# Patient Record
Sex: Male | Born: 1960 | Race: White | Hispanic: No | Marital: Married | Smoking: Former smoker
Health system: Southern US, Community
[De-identification: ages and names within clinical notes are randomized; demographics above are authoritative.]

## PROBLEM LIST (undated history)

## (undated) DIAGNOSIS — I10 Essential (primary) hypertension: Secondary | ICD-10-CM

## (undated) DIAGNOSIS — G4730 Sleep apnea, unspecified: Secondary | ICD-10-CM

## (undated) DIAGNOSIS — E785 Hyperlipidemia, unspecified: Secondary | ICD-10-CM

## (undated) DIAGNOSIS — N200 Calculus of kidney: Secondary | ICD-10-CM

## (undated) DIAGNOSIS — K5090 Crohn's disease, unspecified, without complications: Secondary | ICD-10-CM

## (undated) DIAGNOSIS — K635 Polyp of colon: Secondary | ICD-10-CM

## (undated) DIAGNOSIS — E119 Type 2 diabetes mellitus without complications: Secondary | ICD-10-CM

## (undated) HISTORY — PX: OTHER SURGICAL HISTORY: SHX169

## (undated) HISTORY — DX: Hyperlipidemia, unspecified: E78.5

## (undated) HISTORY — DX: Type 2 diabetes mellitus without complications: E11.9

## (undated) HISTORY — DX: Crohn's disease, unspecified, without complications: K50.90

## (undated) HISTORY — DX: Calculus of kidney: N20.0

## (undated) HISTORY — PX: ABDOMINAL SURGERY: SHX537

## (undated) HISTORY — DX: Sleep apnea, unspecified: G47.30

## (undated) HISTORY — DX: Essential (primary) hypertension: I10

## (undated) HISTORY — DX: Polyp of colon: K63.5

---

## 2021-06-25 ENCOUNTER — Ambulatory Visit: Payer: BC Managed Care – PPO | Admitting: Family Medicine

## 2021-06-25 ENCOUNTER — Encounter: Payer: Self-pay | Admitting: Family Medicine

## 2021-06-25 ENCOUNTER — Other Ambulatory Visit: Payer: Self-pay

## 2021-06-25 ENCOUNTER — Telehealth: Payer: Self-pay | Admitting: *Deleted

## 2021-06-25 VITALS — BP 121/74 | HR 110 | Temp 98.5°F | Ht 71.0 in | Wt 242.8 lb

## 2021-06-25 DIAGNOSIS — K5010 Crohn's disease of large intestine without complications: Secondary | ICD-10-CM

## 2021-06-25 DIAGNOSIS — Z122 Encounter for screening for malignant neoplasm of respiratory organs: Secondary | ICD-10-CM

## 2021-06-25 DIAGNOSIS — E1169 Type 2 diabetes mellitus with other specified complication: Secondary | ICD-10-CM | POA: Diagnosis not present

## 2021-06-25 DIAGNOSIS — E756 Lipid storage disorder, unspecified: Secondary | ICD-10-CM

## 2021-06-25 DIAGNOSIS — F17200 Nicotine dependence, unspecified, uncomplicated: Secondary | ICD-10-CM

## 2021-06-25 MED ORDER — PIOGLITAZONE HCL 45 MG PO TABS: 45.0000 mg | ORAL_TABLET | Freq: Every day | ORAL | 3 refills | Status: DC

## 2021-06-25 MED ORDER — METFORMIN HCL ER 500 MG PO TB24: 2000.0000 mg | ORAL_TABLET | Freq: Every day | ORAL | 3 refills | Status: DC

## 2021-06-25 MED ORDER — SILDENAFIL CITRATE 20 MG PO TABS: 20.0000 mg | ORAL_TABLET | ORAL | 6 refills | Status: DC | PRN

## 2021-06-25 MED ORDER — LISINOPRIL 5 MG PO TABS: 5.0000 mg | ORAL_TABLET | Freq: Every day | ORAL | 3 refills | Status: DC

## 2021-06-25 MED ORDER — GLIMEPIRIDE 4 MG PO TABS: 4.0000 mg | ORAL_TABLET | Freq: Every day | ORAL | 3 refills | Status: DC

## 2021-06-25 MED ORDER — SIMVASTATIN 40 MG PO TABS: 40.0000 mg | ORAL_TABLET | Freq: Every day | ORAL | 3 refills | Status: DC

## 2021-06-25 MED ORDER — GLIMEPIRIDE 1 MG PO TABS: 1.0000 mg | ORAL_TABLET | Freq: Two times a day (BID) | ORAL | 3 refills | Status: DC

## 2021-06-25 NOTE — Telephone Encounter (Signed)
Sildenafil Citrate 20MG tablets  Key: [id number]   Denied by plan - will have to pay out of pocket

## 2021-06-25 NOTE — Progress Notes (Signed)
Subjective:  [name], male    DOB: Feb 21, 1961  Age: 60 y.o. MRN: [id number]  CC: New Patient (Initial Visit)   HPI Mitchell Garza presents for diabetes. Last A1c was 6.9.  That was 2 months ago. Glucose doing well. No logs returned. More concerned now about bringing his Crohn's under tighter control. Past due for remicade.Seeing GI Dr. Danie Binder with [facility]. Getting some abd. Cramping. Two to three loose BMs a day. Concerned this will advance if he can't get the remicade. He relocated from [facility] 2-3 months ago. Prior arrangements at the time he moved, in order  to get the remicade here fell through.   Lived in [place] as an adolescent. Wanted to move back when he retired. Recently retired from [employer].   History Mitchell Garza has a past medical history of Crohn's disease ([place]), Diabetes mellitus without complication ([place]), and Sleep apnea.   He has a past surgical history that includes arthroscopic knee surgery (Bilateral).   His family history includes Alcohol abuse in his mother; Cancer in his father; Diabetes in his brother, mother, and sister.He reports that he has been smoking cigarettes. He has a 30.00 pack-year smoking history. He has never used smokeless tobacco. He reports that he does not currently use alcohol. He reports that he does not use drugs.    ROS Review of Systems  Constitutional: Negative.   HENT: Negative.    Eyes:  Negative for visual disturbance.  Respiratory:  Negative for cough and shortness of breath.   Cardiovascular:  Negative for chest pain and leg swelling.  Gastrointestinal:  Negative for abdominal pain, diarrhea, nausea and vomiting.  Genitourinary:  Negative for difficulty urinating.  Musculoskeletal:  Negative for arthralgias and myalgias.  Skin:  Negative for rash.  Neurological:  Negative for headaches.  Psychiatric/Behavioral:  Negative for sleep disturbance.    Objective:  BP 121/74   Pulse (!) 110   Temp  98.5 F (36.9 C)   Ht 5' 11"  (1.803 m)   Wt 242 lb 12.8 oz (110.1 kg)   SpO2 95%   BMI 33.86 kg/m   BP Readings from Last 3 Encounters:  06/25/21 121/74    Wt Readings from Last 3 Encounters:  06/25/21 242 lb 12.8 oz (110.1 kg)     Physical Exam Constitutional:      General: He is not in acute distress.    Appearance: He is well-developed.  HENT:     Head: Normocephalic and atraumatic.     Right Ear: External ear normal.     Left Ear: External ear normal.     Nose: Nose normal.  Eyes:     Conjunctiva/sclera: Conjunctivae normal.     Pupils: Pupils are equal, round, and reactive to light.  Cardiovascular:     Rate and Rhythm: Normal rate and regular rhythm.     Heart sounds: Normal heart sounds. No murmur heard. Pulmonary:     Effort: Pulmonary effort is normal. No respiratory distress.     Breath sounds: Normal breath sounds. No wheezing or rales.  Abdominal:     Palpations: Abdomen is soft.     Tenderness: There is no abdominal tenderness.  Musculoskeletal:        General: Normal range of motion.     Cervical back: Normal range of motion and neck supple.  Skin:    General: Skin is warm and dry.  Neurological:     Mental Status: He is alert and oriented to person, place,  and time.     Deep Tendon Reflexes: Reflexes are normal and symmetric.  Psychiatric:        Behavior: Behavior normal.        Thought Content: Thought content normal.        Judgment: Judgment normal.      Assessment & Plan:   Broc was seen today for new patient (initial visit).  Diagnoses and all orders for this visit:  Diabetic lipidosis ([place])  Crohn's disease of large intestine without complication ([place])  Encounter for screening for malignant neoplasm of lung in current smoker with 30 pack year history or greater -     CT CHEST LUNG CANCER SCREENING LOW DOSE WO CONTRAST; Future  Other orders -     glimepiride (AMARYL) 1 MG tablet; Take 1 tablet (1 mg total) by mouth in the  morning and at bedtime. -     glimepiride (AMARYL) 4 MG tablet; Take 1 tablet (4 mg total) by mouth daily. -     lisinopril (ZESTRIL) 5 MG tablet; Take 1 tablet (5 mg total) by mouth daily. -     metFORMIN (GLUCOPHAGE-XR) 500 MG 24 hr tablet; Take 4 tablets (2,000 mg total) by mouth daily with breakfast. -     pioglitazone (ACTOS) 45 MG tablet; Take 1 tablet (45 mg total) by mouth daily. -     sildenafil (REVATIO) 20 MG tablet; Take 1 tablet (20 mg total) by mouth as needed. -     simvastatin (ZOCOR) 40 MG tablet; Take 1 tablet (40 mg total) by mouth daily.      I have changed Mitchell Garza's glimepiride, glimepiride, lisinopril, metFORMIN, pioglitazone, sildenafil, and simvastatin. [name] maintain his inFLIXimab.  Allergies as of 06/25/2021   No Known Allergies      Medication List        Accurate as of June 25, 2021  5:42 PM. If you have any questions, ask your nurse or doctor.          glimepiride 1 MG tablet Commonly known as: AMARYL Take 1 tablet (1 mg total) by mouth in the morning and at bedtime. What changed: how much to take Changed by: Claretta Fraise, MD   glimepiride 4 MG tablet Commonly known as: AMARYL Take 1 tablet (4 mg total) by mouth daily. What changed: Another medication with the same name was changed. Make sure you understand how and when to take each. Changed by: Claretta Fraise, MD   inFLIXimab 100 MG injection Commonly known as: REMICADE Inject into the vein every 6 (six) weeks.   lisinopril 5 MG tablet Commonly known as: ZESTRIL Take 1 tablet (5 mg total) by mouth daily.   metFORMIN 500 MG 24 hr tablet Commonly known as: GLUCOPHAGE-XR Take 4 tablets (2,000 mg total) by mouth daily with breakfast. What changed: when to take this Changed by: Claretta Fraise, MD   pioglitazone 45 MG tablet Commonly known as: ACTOS Take 1 tablet (45 mg total) by mouth daily. What changed: when to take this Changed by: Claretta Fraise, MD    sildenafil 20 MG tablet Commonly known as: REVATIO Take 1 tablet (20 mg total) by mouth as needed.   simvastatin 40 MG tablet Commonly known as: ZOCOR Take 1 tablet (40 mg total) by mouth daily.         Follow-up: Return in about 3 months (around 09/24/2021).  Claretta Fraise, M.D.

## 2021-07-04 ENCOUNTER — Ambulatory Visit: Payer: BC Managed Care – PPO

## 2021-09-25 ENCOUNTER — Other Ambulatory Visit: Payer: BC Managed Care – PPO

## 2021-09-25 DIAGNOSIS — K5010 Crohn's disease of large intestine without complications: Secondary | ICD-10-CM

## 2021-09-25 DIAGNOSIS — E756 Lipid storage disorder, unspecified: Secondary | ICD-10-CM

## 2021-09-25 DIAGNOSIS — Z0000 Encounter for general adult medical examination without abnormal findings: Secondary | ICD-10-CM

## 2021-09-25 DIAGNOSIS — E1169 Type 2 diabetes mellitus with other specified complication: Secondary | ICD-10-CM

## 2021-09-25 LAB — CMP14+EGFR
ALT: 23 IU/L (ref 0–44)
AST: 23 IU/L (ref 0–40)
Albumin/Globulin Ratio: 2.1 (ref 1.2–2.2)
Albumin: 4.4 g/dL (ref 3.8–4.9)
Alkaline Phosphatase: 55 IU/L (ref 44–121)
BUN/Creatinine Ratio: 13 (ref 9–20)
BUN: 11 mg/dL (ref 6–24)
Bilirubin Total: 0.4 mg/dL (ref 0.0–1.2)
CO2: 30 mmol/L — ABNORMAL HIGH (ref 20–29)
Calcium: 9.3 mg/dL (ref 8.7–10.2)
Chloride: 103 mmol/L (ref 96–106)
Creatinine, Ser: 0.84 mg/dL (ref 0.76–1.27)
Globulin, Total: 2.1 g/dL (ref 1.5–4.5)
Glucose: 94 mg/dL (ref 70–99)
Potassium: 4.7 mmol/L (ref 3.5–5.2)
Sodium: 142 mmol/L (ref 134–144)
Total Protein: 6.5 g/dL (ref 6.0–8.5)
eGFR: 100 mL/min/{1.73_m2} (ref >59)

## 2021-09-25 LAB — LIPID PANEL
Chol/HDL Ratio: 2.3 ratio (ref 0.0–5.0)
Cholesterol, Total: 118 mg/dL (ref 100–199)
HDL: 52 mg/dL (ref >39)
LDL Chol Calc (NIH): 50 mg/dL (ref 0–99)
Triglycerides: 81 mg/dL (ref 0–149)
VLDL Cholesterol Cal: 16 mg/dL (ref 5–40)

## 2021-09-25 LAB — CBC WITH DIFFERENTIAL/PLATELET
Basophils Absolute: 0.1 10*3/uL (ref 0.0–0.2)
Basos: 1 %
EOS (ABSOLUTE): 0.2 10*3/uL (ref 0.0–0.4)
Eos: 2 %
Hematocrit: 40.1 % (ref 37.5–51.0)
Hemoglobin: 13.4 g/dL (ref 13.0–17.7)
Immature Grans (Abs): 0 10*3/uL (ref 0.0–0.1)
Immature Granulocytes: 0 %
Lymphocytes Absolute: 2.3 10*3/uL (ref 0.7–3.1)
Lymphs: 23 %
MCH: 30.8 pg (ref 26.6–33.0)
MCHC: 33.4 g/dL (ref 31.5–35.7)
MCV: 92 fL (ref 79–97)
Monocytes Absolute: 0.7 10*3/uL (ref 0.1–0.9)
Monocytes: 7 %
Neutrophils Absolute: 6.6 10*3/uL (ref 1.4–7.0)
Neutrophils: 67 %
Platelets: 252 10*3/uL (ref 150–450)
RBC: 4.35 x10E6/uL (ref 4.14–5.80)
RDW: 12.8 % (ref 11.6–15.4)
WBC: 9.9 10*3/uL (ref 3.4–10.8)

## 2021-09-25 LAB — BAYER DCA HB A1C WAIVED: HB A1C (BAYER DCA - WAIVED): 6.4 % — ABNORMAL HIGH (ref 4.8–5.6)

## 2021-09-26 ENCOUNTER — Ambulatory Visit: Payer: BC Managed Care – PPO | Admitting: Family Medicine

## 2021-09-26 ENCOUNTER — Encounter: Payer: Self-pay | Admitting: Family Medicine

## 2021-09-26 VITALS — BP 135/82 | HR 78 | Temp 98.4°F | Ht 71.0 in | Wt 256.0 lb

## 2021-09-26 DIAGNOSIS — G4733 Obstructive sleep apnea (adult) (pediatric): Secondary | ICD-10-CM

## 2021-09-26 DIAGNOSIS — E11319 Type 2 diabetes mellitus with unspecified diabetic retinopathy without macular edema: Secondary | ICD-10-CM | POA: Diagnosis not present

## 2021-09-26 NOTE — Progress Notes (Signed)
Subjective:  [name],  male    DOB: 1961-03-13  Age: 60 y.o.    CC: Medical Management of Chronic Issues   HPI Mitchell Garza presents for  follow-up of hypertension. Patient has no history of headache chest pain or shortness of breath or recent cough. Patient also denies symptoms of TIA such as numbness weakness lateralizing. Patient denies side effects from medication. States taking it regularly.  Patient also  in for follow-up of elevated cholesterol. Doing well without complaints on current medication. Denies side effects  including myalgia and arthralgia and nausea. Also in today for liver function testing. Currently no chest pain, shortness of breath or other cardiovascular related symptoms noted.  Follow-up of diabetes. Patient does check blood sugar at home. Patient denies symptoms such as excessive hunger or urinary frequency, excessive hunger, nausea No significant hypoglycemic spells noted. Medications reviewed. Pt reports taking them regularly. Pt. denies complication/adverse reaction today.    History Mitchell Garza has a past medical history of Crohn's disease ([place]), Diabetes mellitus without complication ([place]), and Sleep apnea.   Mitchell Garza has a past surgical history that includes arthroscopic knee surgery (Bilateral).   His family history includes Alcohol abuse in his mother; Cancer in his father; Diabetes in his brother, mother, and sister.Mitchell Garza reports that Mitchell Garza has been smoking cigarettes. Mitchell Garza has a 30.00 pack-year smoking history. Mitchell Garza has never used smokeless tobacco. Mitchell Garza reports that Mitchell Garza does not currently use alcohol. Mitchell Garza reports that Mitchell Garza does not use drugs.  Current Outpatient Medications on File Prior to Visit  Medication Sig Dispense Refill   glimepiride (AMARYL) 1 MG tablet Take 1 tablet (1 mg total) by mouth in the morning and at bedtime. 180 tablet 3   glimepiride (AMARYL) 4 MG tablet Take 1 tablet (4 mg total) by mouth daily. 90 tablet 3   inFLIXimab (REMICADE)  100 MG injection Inject into the vein every 6 (six) weeks.     lisinopril (ZESTRIL) 5 MG tablet Take 1 tablet (5 mg total) by mouth daily. 90 tablet 3   metFORMIN (GLUCOPHAGE-XR) 500 MG 24 hr tablet Take 4 tablets (2,000 mg total) by mouth daily with breakfast. 360 tablet 3   pioglitazone (ACTOS) 45 MG tablet Take 1 tablet (45 mg total) by mouth daily. 90 tablet 3   sildenafil (REVATIO) 20 MG tablet Take 1 tablet (20 mg total) by mouth as needed. 10 tablet 6   simvastatin (ZOCOR) 40 MG tablet Take 1 tablet (40 mg total) by mouth daily. 90 tablet 3   No current facility-administered medications on file prior to visit.    ROS Review of Systems  Constitutional:  Negative for fever.  Eyes:  Positive for visual disturbance (hx diabetic retinopathy).  Respiratory:  Negative for shortness of breath.   Cardiovascular:  Negative for chest pain.  Musculoskeletal:  Negative for arthralgias.  Skin:  Negative for rash.  Psychiatric/Behavioral:  Positive for sleep disturbance (Not sleeping well for 1-2 weeks. awakening frequently).    Objective:  BP 135/82   Pulse 78   Temp 98.4 F (36.9 C)   Ht 5' 11"  (1.803 m)   Wt 256 lb (116.1 kg)   SpO2 95%   BMI 35.70 kg/m   BP Readings from Last 3 Encounters:  09/26/21 135/82  06/25/21 121/74    Wt Readings from Last 3 Encounters:  09/26/21 256 lb (116.1 kg)  06/25/21 242 lb 12.8 oz (110.1 kg)     Physical Exam Vitals reviewed.  Constitutional:  Appearance: Mitchell Garza is well-developed.  HENT:     Head: Normocephalic and atraumatic.     Right Ear: External ear normal.     Left Ear: External ear normal.     Mouth/Throat:     Pharynx: No oropharyngeal exudate or posterior oropharyngeal erythema.  Eyes:     Pupils: Pupils are equal, round, and reactive to light.  Cardiovascular:     Rate and Rhythm: Normal rate and regular rhythm.     Heart sounds: No murmur heard. Pulmonary:     Effort: No respiratory distress.     Breath sounds: Normal  breath sounds.  Musculoskeletal:     Cervical back: Normal range of motion and neck supple.  Neurological:     Mental Status: Mitchell Garza is alert and oriented to person, place, and time.    Diabetic Foot Exam - Simple   No data filed       Assessment & Plan:   Mitchell Garza was seen today for medical management of chronic issues.  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  No orders of the defined types were placed in this encounter.    Follow-up: Return in about 3 months (around 12/25/2021).  Claretta Fraise, M.D.

## 2021-09-29 NOTE — Progress Notes (Signed)
Hello Minnie,  Your lab result is normal and/or stable.Some minor variations that are not significant are commonly marked abnormal, but do not represent any medical problem for you.  Best regards, Claretta Fraise, M.D.

## 2021-10-01 ENCOUNTER — Other Ambulatory Visit: Payer: Self-pay | Admitting: Family Medicine

## 2021-10-01 DIAGNOSIS — E11319 Type 2 diabetes mellitus with unspecified diabetic retinopathy without macular edema: Secondary | ICD-10-CM

## 2021-10-30 ENCOUNTER — Encounter (HOSPITAL_COMMUNITY): Payer: Self-pay

## 2021-10-30 NOTE — Progress Notes (Signed)
Attempted to reach patient regarding LCS. Unable to reach patient at this time. Detailed VM left asking that the patient return my call.

## 2021-11-11 ENCOUNTER — Encounter (HOSPITAL_COMMUNITY): Payer: Self-pay

## 2021-11-11 NOTE — Progress Notes (Signed)
Call placed to patient regarding LCS. Patient interested but would like to discuss further with PCP. Referral closed at patient's request at this time.

## 2021-11-19 ENCOUNTER — Encounter (HOSPITAL_COMMUNITY): Payer: Self-pay

## 2021-11-19 ENCOUNTER — Other Ambulatory Visit (HOSPITAL_COMMUNITY): Payer: Self-pay

## 2021-11-19 DIAGNOSIS — Z122 Encounter for screening for malignant neoplasm of respiratory organs: Secondary | ICD-10-CM

## 2021-11-19 DIAGNOSIS — Z87891 Personal history of nicotine dependence: Secondary | ICD-10-CM

## 2021-11-19 NOTE — Progress Notes (Signed)
Order placed for LDCT per protocol

## 2021-11-19 NOTE — Progress Notes (Signed)
Received referral for initial lung cancer screening scan. Contacted patient and obtained smoking history (started age 62, current smoker continuing to smoke at 1PPD, 39 pack year) as well as answering questions related to the screening process. Patient denies signs/symptoms of lung cancer such as weight loss or hemoptysis. Patient denies comorbidity that would prevent curative treatment if lung cancer were to be found. Patient is scheduled for shared decision making visit and CT scan on 12/03/2021 at 1115.

## 2021-11-20 ENCOUNTER — Encounter: Payer: Self-pay | Admitting: Neurology

## 2021-11-20 ENCOUNTER — Ambulatory Visit: Payer: BC Managed Care – PPO | Admitting: Neurology

## 2021-11-20 VITALS — BP 143/83 | HR 87 | Ht 71.0 in | Wt 264.0 lb

## 2021-11-20 DIAGNOSIS — G4733 Obstructive sleep apnea (adult) (pediatric): Secondary | ICD-10-CM | POA: Diagnosis not present

## 2021-11-20 DIAGNOSIS — R635 Abnormal weight gain: Secondary | ICD-10-CM

## 2021-11-20 DIAGNOSIS — G4763 Sleep related bruxism: Secondary | ICD-10-CM

## 2021-11-20 DIAGNOSIS — E669 Obesity, unspecified: Secondary | ICD-10-CM | POA: Diagnosis not present

## 2021-11-20 DIAGNOSIS — G479 Sleep disorder, unspecified: Secondary | ICD-10-CM

## 2021-11-20 DIAGNOSIS — R351 Nocturia: Secondary | ICD-10-CM

## 2021-11-20 DIAGNOSIS — G4719 Other hypersomnia: Secondary | ICD-10-CM | POA: Diagnosis not present

## 2021-11-20 NOTE — Patient Instructions (Signed)

## 2021-11-20 NOTE — Progress Notes (Signed)
Subjective:    Patient ID: Hasaan Radde is a 61 y.o. male.  HPI    Star Age, MD, PhD [facility] [address] [place], [place] [address]  Dear Dr. Livia Snellen,   I saw your patient, Boleslaus Holloway, upon your kind request, in my sleep clinic today for initial consultation of his sleep disorder, in particular, evaluation of his prior diagnosis of obstructive sleep apnea.  The patient is unaccompanied today.  As you know, Mr. Whitham is a 61 year old right-handed gentleman with an underlying medical history of diabetes, diabetic retinopathy, Crohn's disease, and obesity, who was previously diagnosed with obstructive sleep apnea and placed on positive airway pressure treatment.  He no longer has a machine and has not used a PAP machine in about 3 years.  He moved recently from [place] state.  Prior sleep study results are not available for my review today, he [place] out of state [place].  As per his recollection he had a home sleep test that showed severe sleep apnea at the time.  He had difficulty tolerating positive airway pressure particularly with a different masks he tried and with the pressure.  He is not resting well and has recently struggled with difficulty falling asleep and staying asleep.  He has had nighttime anxiety.  He has found that sleeping in the recliner helps him a little bit to obtain more sleep.  I reviewed your office note from 09/26/2022.  His Epworth sleepiness score is 7 out of 24, fatigue severity score is 35 out of 63.  He has gained weight in the past year, in the realm of 30 to 40 pounds.  He is a retired Engineer, structural. Some 5 years ago and is wife is concerned about his snoring and sleep apnea.  He drinks quite a bit of caffeine in the form of coffee, about 4 to 5 cups before 11 AM and tea about 2 to 3 glasses/day.  He has a history of bruxism and has a dentist may need bite guard for the past couple years.  He has  2 sons from his previous marriage and has 3 stepchildren who are also grown and live in [place] state.  He lives with his wife who works for the school system.  They have 1 dog in the household.  Bedtime is generally between 11 and midnight and rise time is around 8 if he can sleep in the recliner.  If he were to sleep in the bed, he is consistently up before 3 AM.  He has nocturia about once per average night.  Does not have any recurrent morning headaches.  He smokes about a pack to a pack and 1/2 per day.  He would like to eventually quit.  He drinks alcohol very occasionally.  His Past Medical History Is Significant For: Past Medical History:  Diagnosis Date   Crohn's disease ([place])    Diabetes mellitus without complication ([place])    Sleep apnea     His Past Surgical History Is Significant For: Past Surgical History:  Procedure Laterality Date   arthroscopic knee surgery Bilateral     His Family History Is Significant For: Family History  Problem Relation Age of Onset   Alcohol abuse Mother    Diabetes Mother    Cancer Father    Diabetes Sister    Diabetes Brother     His Social History Is Significant For: Social History   Socioeconomic History   Marital status:  Married    Spouse name: Amy   Number of children: 1   Years of education: Policeman   Highest education level: Not on file  Occupational History   Occupation: Retired  Tobacco Use   Smoking status: Every Day    Packs/day: 1.00    Years: 30.00    Pack years: 30.00    Types: Cigarettes   Smokeless tobacco: Never  Vaping Use   Vaping Use: Never used  Substance and Sexual Activity   Alcohol use: Not Currently    Comment: rare   Drug use: Never   Sexual activity: Not on file  Other Topics Concern   Not on file  Social History Narrative   Right handed   Caffeine 5 cups per day    Social Determinants of Health   Financial Resource Strain: Not on file  Food Insecurity: Not on file  Transportation  Needs: Not on file  Physical Activity: Not on file  Stress: Not on file  Social Connections: Not on file    His Allergies Are:  No Known Allergies:   His Current Medications Are:  Outpatient Encounter Medications as of 11/20/2021  Medication Sig   glimepiride (AMARYL) 1 MG tablet Take 1 tablet (1 mg total) by mouth in the morning and at bedtime.   glimepiride (AMARYL) 4 MG tablet Take 1 tablet (4 mg total) by mouth daily.   inFLIXimab (REMICADE) 100 MG injection Inject into the vein every 6 (six) weeks.   lisinopril (ZESTRIL) 5 MG tablet Take 1 tablet (5 mg total) by mouth daily.   metFORMIN (GLUCOPHAGE-XR) 500 MG 24 hr tablet Take 4 tablets (2,000 mg total) by mouth daily with breakfast.   pioglitazone (ACTOS) 45 MG tablet Take 1 tablet (45 mg total) by mouth daily.   sildenafil (REVATIO) 20 MG tablet Take 1 tablet (20 mg total) by mouth as needed.   simvastatin (ZOCOR) 40 MG tablet Take 1 tablet (40 mg total) by mouth daily.   No facility-administered encounter medications on file as of 11/20/2021.  :   Review of Systems:  Out of a complete 14 point review of systems, all are reviewed and negative with the exception of these symptoms as listed below:  Review of Systems  Neurological:        Here for sleep consult. Prior sleep study OSA was found tried CPAP for a short time trouble tolerating CPAP mask. Snoring is present.   ESS total score 7 FSS total score 35    Objective:  Neurological Exam  Physical Exam Physical Examination:   Vitals:   11/20/21 1531  BP: (!) 143/83  Pulse: 87    General Examination: The patient is a very pleasant 61 y.o. male in no acute distress. He appears well-developed and well-nourished and well groomed.   HEENT: Normocephalic, atraumatic, pupils are equal, round and reactive to light, extraocular tracking is good without limitation to gaze excursion or nystagmus noted. Hearing is grossly intact. Face is symmetric with normal facial  animation. Speech is clear with no dysarthria noted. There is no hypophonia. There is no lip, neck/head, jaw or voice tremor. Neck is supple with full range of passive and active motion. There are no carotid bruits on auscultation. Oropharynx exam reveals: mild mouth dryness, adequate dental hygiene and marked airway crowding, due to tonsillar size of about 2+ bilaterally, larger uvula noted which is mildly erythematous and mildly swollen appearing.  Mallampati class III.    Chest: Clear to auscultation without wheezing, rhonchi or  crackles noted.  Heart: S1+S2+0, regular and normal without murmurs, rubs or gallops noted.   Abdomen: Soft, non-tender and non-distended.  Extremities: There is trace pitting edema in the distal lower extremities bilaterally.   Skin: Warm and dry without trophic changes noted.   Musculoskeletal: exam reveals no obvious joint deformities.   Neurologically:  Mental status: The patient is awake, alert and oriented in all 4 spheres. His immediate and remote memory, attention, language skills and fund of knowledge are appropriate. There is no evidence of aphasia, agnosia, apraxia or anomia. Speech is clear with normal prosody and enunciation. Thought process is linear. Mood is normal and affect is normal.  Cranial nerves II - XII are as described above under HEENT exam.  Motor exam: Normal bulk, strength and tone is noted. There is no tremor. Fine motor skills and coordination: grossly intact.  Cerebellar testing: No dysmetria or intention tremor. There is no truncal or gait ataxia.  Sensory exam: intact to light touch in the upper and lower extremities.  Gait, station and balance: He stands easily. No veering to one side is noted. No leaning to one side is noted. Posture is age-appropriate and stance is narrow based. Gait shows normal stride length and normal pace. No problems turning are noted.   Assessment and Plan:   In summary, Rebekah Sprinkle is a very pleasant 61  y.o.-year old male with an underlying medical history of diabetes, diabetic retinopathy, Crohn's disease, and obesity, whose history and physical exam are concerning for obstructive sleep apnea (OSA). I had a long chat with the patient about my findings and the diagnosis of OSA, its prognosis and treatment options. We talked about medical treatments, surgical interventions and non-pharmacological approaches. I explained in particular the risks and ramifications of untreated moderate to severe OSA, especially with respect to developing cardiovascular disease down the Road, including congestive heart failure, difficult to treat hypertension, cardiac arrhythmias, or stroke. Even type 2 diabetes has, in part, been linked to untreated OSA. Symptoms of untreated OSA include daytime sleepiness, memory problems, mood irritability and mood disorder such as depression and anxiety, lack of energy, as well as recurrent headaches, especially morning headaches. We talked about smoking cessation and trying to maintain a healthy lifestyle in general, as well as the importance of weight control. We also talked about the importance of good sleep hygiene. I recommended the following at this time: sleep study.  I outlined the differences between a laboratory attended sleep study versus home sleep test.  I explained the sleep test procedures to the patient and also outlined possible surgical and non-surgical treatment options of OSA, including the use of a custom-made dental device (which would require a referral to a specialist dentist or oral surgeon), upper airway surgical options, such as traditional UPPP or a novel less invasive surgical option in the form of Inspire hypoglossal nerve stimulation (which would involve a referral to an ENT surgeon). I also explained the CPAP treatment option to the patient, who indicated that he would be willing to try PAP therapy again if the need arises. I explained the importance of being  compliant with PAP treatment, not only for insurance purposes but primarily to improve His symptoms, and for the patient's long term health benefit, including to reduce His cardiovascular risks. I answered all his questions today and the patient was in agreement. I plan to see him back after the sleep study is completed and encouraged him to call with any interim questions, concerns, problems or updates.  Thank you very much for allowing me to participate in the care of this nice patient. If I can be of any further assistance to you please do not hesitate to call me at [phone].  Sincerely,   Star Age, MD, PhD

## 2021-12-02 NOTE — Progress Notes (Signed)
Mitchell Garza  Visit Date: 12/03/21   Visit Type: In-Person at [place] DECISION-MAKING VISIT - Age: 61 y.o.  - Pack year smoking history: 39 pack-years  - Type of tobacco abuse: Cigarettes - Current smoker or < 15 years of cessation: Current smoker, 1 PPD - No current symptoms of lung cancer:  Patient denies any hemoptysis, unintentional weight loss, and unexplained cough - Risks and benefits of lung cancer screening discussed: Negative: Over-diagnosis, radiation exposure, false positives, and additional testing Positive: Discover early stage lung cancer resulting in higher incidence of cure - Patient educated regarding the importance of adherence to continued lung cancer screening. - Currently, there are no co-morbidities to prevent treatment to therapy for lung cancer and the patient is agreeable to pursue treatment if a malignancy is discovered.  [place] Preventative Services Task Force recommend annual screening for lung cancer with low-dose CT in adults aged 45 - 55 years who have a 20+ pack year smoking history and currently smoke or have quit smoking within the past 15 years.  Screening should be discontinued once a person has not smoked for 15 years or develops a health problem that substantially limits life expectancy or the ability or willingness to have curative lung surgery.  It is a category B recommendation.  Similar stances are provided by CMS, NCCN, and AATS.  Social History   Tobacco Use   Smoking status: Every Day    Packs/day: 1.00    Years: 30.00    Pack years: 30.00    Types: Cigarettes   Smokeless tobacco: Never  Vaping Use   Vaping Use: Never used  Substance Use Topics   Alcohol use: Not Currently    Comment: rare   Drug use: Never     Personal history of tobacco use presenting hazards to health: - This patient meets criteria for low-dose CT lung cancer screening  - The shared decision making visit discussion  included risks and benefits of screening, potential for follow-up, diagnostic testing for abnormal scans, potential for false positive tests, overdiagnosis, discussion about total radiation exposure - Patient stated willingness to undergo diagnostics and treatment as needed - Patient was counseled on smoking cessation to decrease the  risk of lung cancer, pulmonary disease, heart disease, and stroke - Patient has been referred to Lung Cancer Screening Nurse Coordinator for further scheduling of LDCT and for further resources regarding free nicotine replacement therapy and information about smoking cessation classes - Counseling on the importance of adherence to annual lung cancer LDCT screening, impact of co-morbidities, and ability or willingness to undergo diagnosis and treatment is imperative for compliance of the program. - Counseling on the importance of continued smoking cessation for former smokers; the importance of smoking cessation for current smokers, and information about tobacco cessation interventions have been given to patient including [place] and 1-800-quit [place] programs.  Yearly follow up will be coordinated by Adonis Huguenin, RN, MSN [facility] Oncology Nurse Navigator.)  Harriett Rush, PA-C  12/03/21 11:26 AM

## 2021-12-03 ENCOUNTER — Ambulatory Visit (HOSPITAL_COMMUNITY)
Admission: RE | Admit: 2021-12-03 | Discharge: 2021-12-03 | Disposition: A | Discharge status: Home / Self Care | Payer: BC Managed Care – PPO | Source: Ambulatory Visit | Attending: Physician Assistant | Admitting: Physician Assistant

## 2021-12-03 ENCOUNTER — Inpatient Hospital Stay (HOSPITAL_COMMUNITY): Payer: BC Managed Care – PPO | Attending: Physician Assistant | Admitting: Physician Assistant

## 2021-12-03 ENCOUNTER — Telehealth: Payer: Self-pay

## 2021-12-03 ENCOUNTER — Other Ambulatory Visit: Payer: Self-pay

## 2021-12-03 DIAGNOSIS — Z122 Encounter for screening for malignant neoplasm of respiratory organs: Secondary | ICD-10-CM | POA: Diagnosis present

## 2021-12-03 DIAGNOSIS — Z87891 Personal history of nicotine dependence: Secondary | ICD-10-CM | POA: Diagnosis present

## 2021-12-03 DIAGNOSIS — F17210 Nicotine dependence, cigarettes, uncomplicated: Secondary | ICD-10-CM

## 2021-12-03 NOTE — Telephone Encounter (Signed)
LVM for pt to call me back to schedule sleep study  

## 2021-12-03 NOTE — Patient Instructions (Signed)
You were seen today for your shared decision making visit and a low-dose CT scan for lung cancer screening.     Thank you for your participation in this life-saving program!

## 2021-12-09 ENCOUNTER — Telehealth: Payer: Self-pay

## 2021-12-09 NOTE — Telephone Encounter (Signed)
Returned pt's call and LVM for pt to call me back to schedule sleep study ° °

## 2021-12-11 ENCOUNTER — Encounter (HOSPITAL_COMMUNITY): Payer: Self-pay

## 2021-12-11 NOTE — Progress Notes (Signed)
Patient notified of LDCT Lung Cancer Screening Results via mail with the recommendation to follow-up in 12 months. Patient's referring provider has been sent a copy of results. Results are as follows:   IMPRESSION: 1. Lung-RADS 2, benign appearance or behavior. Continue annual screening with low-dose chest CT without contrast in 12 months. 2. Coronary artery calcifications. 3. Aortic Atherosclerosis (ICD10-I70.0) and Emphysema (ICD10-J43.9).

## 2021-12-12 ENCOUNTER — Telehealth: Payer: Self-pay

## 2021-12-12 NOTE — Telephone Encounter (Signed)
We have attempted to call the patient 2 times to schedule sleep study. Patient has been unavailable at the phone numbers we have on file and has not returned our calls. If patient calls back we will schedule them for their sleep study. ° °

## 2021-12-23 ENCOUNTER — Telehealth: Payer: Self-pay | Admitting: Family Medicine

## 2021-12-23 NOTE — Telephone Encounter (Signed)
Pt had labs 09/2021. WNL per [facility]. Left message advising pt that he should not require an labs. If he does want to fast regardless, then he may have black coffee or water. ?

## 2021-12-26 ENCOUNTER — Ambulatory Visit: Payer: BC Managed Care – PPO | Admitting: Family Medicine

## 2021-12-26 ENCOUNTER — Encounter: Payer: Self-pay | Admitting: Family Medicine

## 2021-12-26 VITALS — BP 121/70 | HR 79 | Temp 98.5°F | Ht 71.0 in | Wt 263.8 lb

## 2021-12-26 DIAGNOSIS — F17200 Nicotine dependence, unspecified, uncomplicated: Secondary | ICD-10-CM

## 2021-12-26 DIAGNOSIS — E1169 Type 2 diabetes mellitus with other specified complication: Secondary | ICD-10-CM

## 2021-12-26 DIAGNOSIS — E756 Lipid storage disorder, unspecified: Secondary | ICD-10-CM

## 2021-12-26 DIAGNOSIS — J441 Chronic obstructive pulmonary disease with (acute) exacerbation: Secondary | ICD-10-CM

## 2021-12-26 DIAGNOSIS — H9311 Tinnitus, right ear: Secondary | ICD-10-CM

## 2021-12-26 DIAGNOSIS — D485 Neoplasm of uncertain behavior of skin: Secondary | ICD-10-CM

## 2021-12-26 DIAGNOSIS — K5010 Crohn's disease of large intestine without complications: Secondary | ICD-10-CM

## 2021-12-26 LAB — LIPID PANEL

## 2021-12-26 LAB — BAYER DCA HB A1C WAIVED: HB A1C (BAYER DCA - WAIVED): 6.7 % — ABNORMAL HIGH (ref 4.8–5.6)

## 2021-12-26 MED ORDER — CHANTIX STARTING MONTH PAK 0.5 MG X 11 & 1 MG X 42 PO TBPK: 1.0000 | ORAL_TABLET | Freq: Two times a day (BID) | ORAL | 0 refills | Status: DC

## 2021-12-26 MED ORDER — VARENICLINE TARTRATE 1 MG PO TABS: 1.0000 mg | ORAL_TABLET | Freq: Two times a day (BID) | ORAL | 5 refills | Status: DC

## 2021-12-26 MED ORDER — CHERATUSSIN AC 100-10 MG/5ML PO SOLN: 5.0000 mL | ORAL | 0 refills | Status: DC | PRN

## 2021-12-26 MED ORDER — AMOXICILLIN-POT CLAVULANATE 875-125 MG PO TABS: 1.0000 | ORAL_TABLET | Freq: Two times a day (BID) | ORAL | 0 refills | Status: DC

## 2021-12-26 NOTE — Progress Notes (Signed)
? ?Subjective:  ?[name], male    DOB: [birth year]  Age: 61 y.o. MRN: [id number] ? ?CC: Medical Management of Chronic Issues ? ? ?HPI ?Mitchell Garza presents forFollow-up of diabetes. Patient checks blood sugar at home.  ? 110 fasting and 110 postprandial ?Patient denies symptoms such as polyuria, polydipsia, excessive hunger, nausea ?No significant hypoglycemic spells noted. ?Medications reviewed. Pt reports taking them regularly without complication/adverse reaction being reported today.  ?Checking feet daily. ?Last eye appt was recent ? ?Right ankle swollen X 2 mos. NKI ? ?History ?Mitchell Garza has a past medical history of Crohn's disease ([place]), Diabetes mellitus without complication ([place]), and Sleep apnea.  ? ?He has a past surgical history that includes arthroscopic knee surgery (Bilateral).  ? ?His family history includes Alcohol abuse in his mother; Cancer in his father; Diabetes in his brother, mother, and sister.He reports that he has been smoking cigarettes. He has a 30.00 pack-year smoking history. He has never used smokeless tobacco. He reports that he does not currently use alcohol. He reports that he does not use drugs. ? ?Current Outpatient Medications on File Prior to Visit  ?Medication Sig Dispense Refill  ? glimepiride (AMARYL) 1 MG tablet Take 1 tablet (1 mg total) by mouth in the morning and at bedtime. 180 tablet 3  ? glimepiride (AMARYL) 4 MG tablet Take 1 tablet (4 mg total) by mouth daily. 90 tablet 3  ? inFLIXimab (REMICADE) 100 MG injection Inject into the vein every 6 (six) weeks.    ? lisinopril (ZESTRIL) 5 MG tablet Take 1 tablet (5 mg total) by mouth daily. 90 tablet 3  ? metFORMIN (GLUCOPHAGE-XR) 500 MG 24 hr tablet Take 4 tablets (2,000 mg total) by mouth daily with breakfast. 360 tablet 3  ? pioglitazone (ACTOS) 45 MG tablet Take 1 tablet (45 mg total) by mouth daily. 90 tablet 3  ? sildenafil (REVATIO) 20 MG tablet Take 1 tablet (20 mg total) by mouth as needed. 10  tablet 6  ? simvastatin (ZOCOR) 40 MG tablet Take 1 tablet (40 mg total) by mouth daily. 90 tablet 3  ? ?No current facility-administered medications on file prior to visit.  ? ? ?ROS ?Review of Systems  ?Constitutional:  Negative for fever.  ?HENT:  Positive for tinnitus (2 weeks, echo in right ear).   ?Respiratory:  Positive for cough (sputum is greenish) and chest tightness. Negative for shortness of breath.   ?Cardiovascular:  Positive for leg swelling (right ankle). Negative for chest pain.  ?Musculoskeletal:  Negative for arthralgias.  ?Skin:  Negative for rash.  ? ?Objective:  ?BP 121/70   Pulse 79   Temp 98.5 ?F (36.9 ?C)   Ht 5' 11"  (1.803 m)   Wt 263 lb 12.8 oz (119.7 kg)   SpO2 95%   BMI 36.79 kg/m?  ? ?BP Readings from Last 3 Encounters:  ?12/26/21 121/70  ?11/20/21 (!) 143/83  ?09/26/21 135/82  ? ? ?Wt Readings from Last 3 Encounters:  ?12/26/21 263 lb 12.8 oz (119.7 kg)  ?11/20/21 264 lb (119.7 kg)  ?09/26/21 256 lb (116.1 kg)  ? ? ? ?Physical Exam ?Constitutional:   ?   General: He is not in acute distress. ?   Appearance: He is well-developed.  ?HENT:  ?   Head: Normocephalic and atraumatic.  ?   Right Ear: Tympanic membrane and external ear normal.  ?   Left Ear: Tympanic membrane and external ear normal.  ?   Nose: Nose normal.  ?Eyes:  ?  Conjunctiva/sclera: Conjunctivae normal.  ?   Pupils: Pupils are equal, round, and reactive to light.  ?Cardiovascular:  ?   Rate and Rhythm: Normal rate and regular rhythm.  ?   Heart sounds: Normal heart sounds. No murmur heard. ?  No gallop.  ?   Comments: Multiple varicosities, BLE ? ?Pulmonary:  ?   Effort: Pulmonary effort is normal. No respiratory distress.  ?   Breath sounds: Wheezing and rhonchi present. No rales.  ?Abdominal:  ?   Palpations: Abdomen is soft.  ?   Tenderness: There is no abdominal tenderness.  ?Musculoskeletal:     ?   General: Normal range of motion.  ?   Cervical back: Normal range of motion and neck supple.  ?   Right lower  leg: 2+ Edema present.  ?   Left lower leg: 1+ Edema present.  ?Skin: ?   General: Skin is warm and dry.  ?Neurological:  ?   Mental Status: He is alert and oriented to person, place, and time.  ?   Deep Tendon Reflexes: Reflexes are normal and symmetric.  ?Psychiatric:     ?   Behavior: Behavior normal.     ?   Thought Content: Thought content normal.     ?   Judgment: Judgment normal.  ? ? ? ? ?Assessment & Plan:  ? ?Mitchell Garza was seen today for medical management of chronic issues. ? ?Diagnoses and all orders for this visit: ? 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 ?Crohn's disease of large intestine without complication ([place]) ?-     CBC with Differential/Platelet ?-     CMP14+EGFR ? ?Neoplasm of uncertain behavior of skin ?-     Ambulatory referral to Dermatology ? ?Tinnitus aurium, right ?-     Ambulatory referral to ENT ? ?Smoker ?-     Varenicline Tartrate, Starter, (CHANTIX STARTING MONTH PAK) 0.5 MG X 11 & 1 MG X 42 TBPK; Take 1 tablet by mouth 2 (two) times daily. Use according to package directions ?-     varenicline (CHANTIX CONTINUING MONTH PAK) 1 MG tablet; Take 1 tablet (1 mg total) by mouth 2 (two) times daily. ? ?Acute exacerbation of chronic obstructive pulmonary disease (COPD) (HCC) ?-     amoxicillin-clavulanate (AUGMENTIN) 875-125 MG tablet; Take 1 tablet by mouth 2 (two) times daily. Take all of this medication ?-     guaiFENesin-codeine (CHERATUSSIN AC) 100-10 MG/5ML syrup; Take 5 mLs by mouth every 4 (four) hours as needed for cough. ? ? ? ? ? ?I am having Mitchell Garza start on amoxicillin-clavulanate, Chantix Starting [employer], varenicline, and Cheratussin AC. [name] maintain his inFLIXimab, glimepiride, glimepiride, lisinopril, metFORMIN, pioglitazone, sildenafil, and simvastatin. ? ?Meds  ordered this encounter  ?Medications  ? amoxicillin-clavulanate (AUGMENTIN) 875-125 MG tablet  ?  Sig: Take 1 tablet by mouth 2 (two) times daily. Take all of this medication  ?  Dispense:  20 tablet  ?  Refill:  0  ? Varenicline Tartrate, Starter, (CHANTIX STARTING MONTH PAK) 0.5 MG X 11 & 1 MG X 42 TBPK  ?  Sig: Take 1 tablet by mouth 2 (two) times daily. Use according to package directions  ?  Dispense:  1 each  ?  Refill:  0  ? varenicline (CHANTIX CONTINUING MONTH PAK) 1 MG tablet  ?  Sig: Take 1 tablet (1 mg total) by mouth 2 (two) times daily.  ?  Dispense:  60 tablet  ?  Refill:  5  ?  Fill after pt. Finishes starter please  ? guaiFENesin-codeine (CHERATUSSIN AC) 100-10 MG/5ML syrup  ?  Sig: Take 5 mLs by mouth every 4 (four) hours as needed for cough.  ?  Dispense:  180 mL  ?  Refill:  0  ? ? ? ?Follow-up: Return in about 3 months (around 03/28/2022). ? ?Claretta Fraise, M.D. ?

## 2021-12-27 LAB — CBC WITH DIFFERENTIAL/PLATELET
Basophils Absolute: 0 10*3/uL (ref 0.0–0.2)
Basos: 1 %
EOS (ABSOLUTE): 0.3 10*3/uL (ref 0.0–0.4)
Eos: 3 %
Hematocrit: 40.6 % (ref 37.5–51.0)
Hemoglobin: 14.1 g/dL (ref 13.0–17.7)
Immature Grans (Abs): 0 10*3/uL (ref 0.0–0.1)
Immature Granulocytes: 1 %
Lymphocytes Absolute: 2.2 10*3/uL (ref 0.7–3.1)
Lymphs: 25 %
MCH: 31.5 pg (ref 26.6–33.0)
MCHC: 34.7 g/dL (ref 31.5–35.7)
MCV: 91 fL (ref 79–97)
Monocytes Absolute: 0.7 10*3/uL (ref 0.1–0.9)
Monocytes: 8 %
Neutrophils Absolute: 5.6 10*3/uL (ref 1.4–7.0)
Neutrophils: 62 %
Platelets: 261 10*3/uL (ref 150–450)
RBC: 4.47 x10E6/uL (ref 4.14–5.80)
RDW: 12.7 % (ref 11.6–15.4)
WBC: 8.9 10*3/uL (ref 3.4–10.8)

## 2021-12-27 LAB — CMP14+EGFR
ALT: 21 IU/L (ref 0–44)
AST: 19 IU/L (ref 0–40)
Albumin/Globulin Ratio: 2 (ref 1.2–2.2)
Albumin: 4.4 g/dL (ref 3.8–4.9)
Alkaline Phosphatase: 57 IU/L (ref 44–121)
BUN/Creatinine Ratio: 12 (ref 9–20)
BUN: 11 mg/dL (ref 6–24)
Bilirubin Total: 0.4 mg/dL (ref 0.0–1.2)
CO2: 24 mmol/L (ref 20–29)
Calcium: 9.5 mg/dL (ref 8.7–10.2)
Chloride: 102 mmol/L (ref 96–106)
Creatinine, Ser: 0.89 mg/dL (ref 0.76–1.27)
Globulin, Total: 2.2 g/dL (ref 1.5–4.5)
Glucose: 191 mg/dL — ABNORMAL HIGH (ref 70–99)
Potassium: 4.8 mmol/L (ref 3.5–5.2)
Sodium: 143 mmol/L (ref 134–144)
Total Protein: 6.6 g/dL (ref 6.0–8.5)
eGFR: 99 mL/min/{1.73_m2} (ref >59)

## 2021-12-27 LAB — LIPID PANEL
Chol/HDL Ratio: 2.3 ratio (ref 0.0–5.0)
Cholesterol, Total: 123 mg/dL (ref 100–199)
HDL: 53 mg/dL (ref >39)
LDL Chol Calc (NIH): 53 mg/dL (ref 0–99)
Triglycerides: 85 mg/dL (ref 0–149)
VLDL Cholesterol Cal: 17 mg/dL (ref 5–40)

## 2021-12-29 NOTE — Progress Notes (Signed)
Hello Juanda Crumble, ? ?Your lab result is normal and/or stable.Some minor variations that are not significant are commonly marked abnormal, but do not represent any medical problem for you. ? ?Best regards, ?Claretta Fraise, M.D.

## 2022-01-06 DIAGNOSIS — K5000 Crohn's disease of small intestine without complications: Secondary | ICD-10-CM | POA: Insufficient documentation

## 2022-01-17 ENCOUNTER — Other Ambulatory Visit: Payer: Self-pay | Admitting: Family Medicine

## 2022-01-17 DIAGNOSIS — F17200 Nicotine dependence, unspecified, uncomplicated: Secondary | ICD-10-CM

## 2022-02-19 ENCOUNTER — Ambulatory Visit (INDEPENDENT_AMBULATORY_CARE_PROVIDER_SITE_OTHER): Payer: BC Managed Care – PPO | Admitting: Neurology

## 2022-02-19 DIAGNOSIS — R635 Abnormal weight gain: Secondary | ICD-10-CM

## 2022-02-19 DIAGNOSIS — G479 Sleep disorder, unspecified: Secondary | ICD-10-CM

## 2022-02-19 DIAGNOSIS — E669 Obesity, unspecified: Secondary | ICD-10-CM

## 2022-02-19 DIAGNOSIS — G4719 Other hypersomnia: Secondary | ICD-10-CM

## 2022-02-19 DIAGNOSIS — R351 Nocturia: Secondary | ICD-10-CM

## 2022-02-19 DIAGNOSIS — G4733 Obstructive sleep apnea (adult) (pediatric): Secondary | ICD-10-CM

## 2022-02-19 DIAGNOSIS — G4763 Sleep related bruxism: Secondary | ICD-10-CM

## 2022-02-24 NOTE — Progress Notes (Signed)
See procedure note.

## 2022-02-25 NOTE — Addendum Note (Signed)
Addended by: Star Age on: 02/25/2022 06:06 PM ? ? Modules accepted: Orders ? ?

## 2022-02-25 NOTE — Procedures (Signed)
? ?  [facility] ? ?HOME SLEEP TEST (Watch PAT) REPORT ? ?STUDY DATE: 02/19/2022 ? ?DOB: 1960-10-24 ? ?MRN: [id number] ? ?ORDERING CLINICIAN: Star Age, MD, PhD ?  ?REFERRING CLINICIAN: Claretta Fraise, MD  ? ?CLINICAL INFORMATION/HISTORY: 61 year old right-handed gentleman with an underlying medical history of diabetes, diabetic retinopathy, Crohn's disease, and obesity, who was previously diagnosed with obstructive sleep apnea and placed on positive airway pressure treatment.  He no longer has a machine and has not used a PAP machine in about 3 years.  ? ?Epworth sleepiness score: 7/24. ? ?BMI: 37 kg/m? ? ?FINDINGS:  ? ?Sleep Summary:  ? ?Total Recording Time (hours, min): 8 hours, 21 minutes ? ?Total Sleep Time (hours, min):  7 hours, 33 minutes  ? ?Percent REM (%):    24.9%  ? ?Respiratory Indices:  ? ?Calculated pAHI (per hour):  29.4/hour        ? ?REM pAHI:    33.8/hour      ? ?NREM pAHI: 27.9/hour ? ?Oxygen Saturation Statistics:  ?  ?Oxygen Saturation (%) Mean: 91%  ? ?Minimum oxygen saturation (%):                 82%  ? ?O2 Saturation Range (%): 82-97%   ? ?O2 Saturation (minutes) <=88%: 24.2 min ? ?Pulse Rate Statistics:  ? ?Pulse Mean (bpm):    83/min   ? ?Pulse Range (63-121/min)  ? ?IMPRESSION: OSA (obstructive sleep apnea)  ? ?RECOMMENDATION:  ?This home sleep test demonstrates moderate obstructive sleep apnea with a total AHI of 29.4/hour and O2 nadir of 82%.  Intermittent mild to moderate snoring was detected.  Treatment with positive airway pressure is recommended. The patient will be advised to proceed with an autoPAP titration/trial at home for now. A full night titration study may be considered to optimize treatment settings, if needed down the road. Please note that untreated obstructive sleep apnea may carry additional perioperative morbidity. Patients with significant obstructive sleep apnea should receive perioperative PAP therapy and the surgeons and particularly the  anesthesiologist should be informed of the diagnosis and the severity of the sleep disordered breathing.  Alternative treatment options may include a dental device or surgical option with inspire, hypoglossal nerve stimulator in selected patients.  Concomitant weight loss is highly recommended. ?The patient should be cautioned not to drive, work at heights, or operate dangerous or heavy equipment when tired or sleepy. Review and reiteration of good sleep hygiene measures should be pursued with any patient. ?Other causes of the patient's symptoms, including circadian rhythm disturbances, an underlying mood disorder, medication effect and/or an underlying medical problem cannot be ruled out based on this test. Clinical correlation is recommended. The patient and his referring provider will be notified of the test results. The patient will be seen in follow up in sleep clinic at [facility]. ? ?I certify that I have reviewed the raw data recording prior to the issuance of this report in accordance with the standards of the American Academy of Sleep Medicine (AASM). ? ?INTERPRETING PHYSICIAN:  ? ?Star Age, MD, PhD  ?Board Certified in Neurology and Sleep Medicine ? ?[facility] ?[address], [address] ?[place], [place] [address] ?[phone] ? ? ? ? ? ? ? ? ? ? ? ? ? ? ? ? ? ?

## 2022-02-26 ENCOUNTER — Telehealth: Payer: Self-pay | Admitting: *Deleted

## 2022-02-26 ENCOUNTER — Encounter: Payer: Self-pay | Admitting: *Deleted

## 2022-02-26 NOTE — Telephone Encounter (Signed)
-----   Message from Star Age, MD sent at 02/25/2022  6:06 PM EDT ----- ?Patient referred by Dr. Livia Snellen for re-eval of his OSA Dx, seen by me on 11/20/21, patient had a HST on 02/19/22.   ? ?Please call and notify the patient that the recent home sleep test showed obstructive sleep apnea in the moderate range. I recommend treatment in the form of autoPAP, which means, that we don't have to bring him in for a sleep study with CPAP, but will let him start using a so called autoPAP machine at home, which is a CPAP-like machine with self-adjusting pressures. We will send the order to a local DME company (of his choice, or as per insurance requirement). The DME representative will fit him with a mask, educate him on how to use the machine, how to put the mask on, etc. I have placed an order in the chart. Please send the order, talk to patient, send report to referring MD. We will need a FU in sleep clinic for 10 weeks post-PAP set up, please arrange that with me or one of our NPs. Also reinforce the need for compliance with treatment. Thanks,  ? ?Star Age, MD, PhD ?[facility]) ? ? ? ? ?

## 2022-02-26 NOTE — Telephone Encounter (Signed)
I called pt. I advised pt that Dr. Rexene Alberts reviewed their sleep study results and found that pt has moderate OSA. Dr. Rexene Alberts recommends that pt start Autopap. I reviewed PAP compliance expectations with the pt. Pt is agreeable to starting an auto-PAP. I advised pt that an order will be sent to a DME, [facility], and they will call the pt within about one week after they file with the pt's insurance. [facility] will show the pt how to use the machine, fit for masks, and troubleshoot the auto-PAP if needed. A follow up appt was made for insurance purposes with [place] on 05-07-2022 at 3:45PM. Pt verbalized understanding to arrive 15 minutes early and bring their auto-PAP. A letter with all of this information in it will be mailed to the pt as a reminder. I verified with the pt that the address we have on file is correct. Pt verbalized understanding of results. Pt had no questions at this time but was encouraged to call back if questions arise. I have sent the order to [facility] and have received confirmation that they have received the order.  ?

## 2022-03-31 NOTE — Telephone Encounter (Signed)
[facility] Airsense 11 Setup 03/25/22 (appt needed 04/24/22-06/25/22)

## 2022-04-01 ENCOUNTER — Ambulatory Visit: Payer: BC Managed Care – PPO | Admitting: Family Medicine

## 2022-04-01 ENCOUNTER — Encounter: Payer: Self-pay | Admitting: Family Medicine

## 2022-04-01 VITALS — BP 133/75 | HR 83 | Temp 98.2°F | Ht 71.0 in | Wt 265.8 lb

## 2022-04-01 DIAGNOSIS — K5010 Crohn's disease of large intestine without complications: Secondary | ICD-10-CM

## 2022-04-01 DIAGNOSIS — E756 Lipid storage disorder, unspecified: Secondary | ICD-10-CM

## 2022-04-01 DIAGNOSIS — E1169 Type 2 diabetes mellitus with other specified complication: Secondary | ICD-10-CM | POA: Diagnosis not present

## 2022-04-01 LAB — BAYER DCA HB A1C WAIVED: HB A1C (BAYER DCA - WAIVED): 6.8 % — ABNORMAL HIGH (ref 4.8–5.6)

## 2022-04-01 MED ORDER — BENZONATATE 200 MG PO CAPS
200.0000 mg | ORAL_CAPSULE | Freq: Three times a day (TID) | ORAL | 1 refills | Status: DC | PRN
Start: 2022-04-01 — End: 2022-07-02

## 2022-04-01 MED ORDER — BLOOD GLUCOSE MONITOR KIT
PACK | 9 refills | Status: AC
Start: 2022-04-01 — End: ?

## 2022-04-01 MED ORDER — TRAZODONE HCL 150 MG PO TABS: ORAL_TABLET | ORAL | 5 refills | Status: DC

## 2022-04-01 NOTE — Progress Notes (Signed)
Subjective:  [name],  male    DOB: 02/15/1961  Age: 61 y.o.    CC: Medical Management of Chronic Issues   HPI Patient  in for follow-up of elevated cholesterol. Doing well without complaints on current medication. Denies side effects  including myalgia and arthralgia and nausea. Also in today for liver function testing. Currently no chest pain, shortness of breath or other cardiovascular related symptoms noted.  Follow-up of diabetes. Patient does check blood sugar at home. Readings run between 110 and 130 Patient denies symptoms such as excessive hunger or urinary frequency, excessive hunger, nausea Several  significant hypoglycemic spells noted. Once a week gets a spell down to the 50s.  Medications reviewed. Pt reports taking them regularly. Pt. denies complication/adverse reaction today.   Stopped smoking 1 week ago. Coughing a lot. Productive of clear mucous. Slight green tone.  History Mitchell Garza has a past medical history of Crohn's disease (Mitchell Garza), Diabetes mellitus without complication (Mitchell Garza), and Sleep apnea.   He has a past surgical history that includes arthroscopic knee surgery (Bilateral).   His family history includes Alcohol abuse in his mother; Cancer in his father; Diabetes in his brother, mother, and sister.He reports that he has been smoking cigarettes. He has a 30.00 pack-year smoking history. He has never used smokeless tobacco. He reports that he does not currently use alcohol. He reports that he does not use drugs.  Current Outpatient Medications on File Prior to Visit  Medication Sig Dispense Refill   glimepiride (AMARYL) 4 MG tablet Take 1 tablet (4 mg total) by mouth daily. 90 tablet 3   inFLIXimab (REMICADE) 100 MG injection Inject into the vein every 6 (six) weeks.     lisinopril (ZESTRIL) 5 MG tablet Take 1 tablet (5 mg total) by mouth daily. 90 tablet 3   metFORMIN (GLUCOPHAGE-XR) 500 MG 24 hr tablet Take 4 tablets (2,000 mg total) by mouth  daily with breakfast. 360 tablet 3   pioglitazone (ACTOS) 45 MG tablet Take 1 tablet (45 mg total) by mouth daily. 90 tablet 3   sildenafil (REVATIO) 20 MG tablet Take 1 tablet (20 mg total) by mouth as needed. 10 tablet 6   simvastatin (ZOCOR) 40 MG tablet Take 1 tablet (40 mg total) by mouth daily. 90 tablet 3   varenicline (CHANTIX CONTINUING MONTH PAK) 1 MG tablet Take 1 tablet (1 mg total) by mouth 2 (two) times daily. 60 tablet 5   Varenicline Tartrate, Starter, (CHANTIX STARTING MONTH PAK) 0.5 MG X 11 & 1 MG X 42 TBPK Take 1 tablet by mouth 2 (two) times daily. Use according to package directions 1 each 0   No current facility-administered medications on file prior to visit.    ROS Review of Systems  Constitutional:  Negative for fever.  Respiratory:  Negative for shortness of breath.   Cardiovascular:  Negative for chest pain.  Musculoskeletal:  Negative for arthralgias.  Skin:  Negative for rash.    Objective:  BP 133/75   Pulse 83   Temp 98.2 F (36.8 C)   Ht 5' 11"  (1.803 m)   Wt 265 lb 12.8 oz (120.6 kg)   SpO2 93%   BMI 37.07 kg/m   BP Readings from Last 3 Encounters:  04/01/22 133/75  12/26/21 121/70  11/20/21 (!) 143/83    Wt Readings from Last 3 Encounters:  04/01/22 265 lb 12.8 oz (120.6 kg)  12/26/21 263 lb 12.8 oz (119.7 kg)  11/20/21 264 lb (119.7 kg)  Physical Exam Vitals reviewed.  Constitutional:      Appearance: He is well-developed.  HENT:     Head: Normocephalic and atraumatic.     Right Ear: External ear normal.     Left Ear: External ear normal.     Mouth/Throat:     Pharynx: No oropharyngeal exudate or posterior oropharyngeal erythema.  Eyes:     Pupils: Pupils are equal, round, and reactive to light.  Cardiovascular:     Rate and Rhythm: Normal rate and regular rhythm.     Heart sounds: No murmur heard. Pulmonary:     Effort: No respiratory distress.     Breath sounds: Normal breath sounds.  Musculoskeletal:     Cervical  back: Normal range of motion and neck supple.  Neurological:     Mental Status: He is alert and oriented to person, place, and time.     Diabetic Foot Exam - Simple   Simple Foot Form Diabetic Foot exam was performed with the following findings: Yes 04/01/2022  9:25 AM  Visual Inspection No deformities, no ulcerations, no other skin breakdown bilaterally: Yes See comments: Yes Sensation Testing Intact to touch and monofilament testing bilaterally: Yes Pulse Check Posterior Tibialis and Dorsalis pulse intact bilaterally: Yes Comments Left foot 2.5 cm callus at 1st MTP     Lab Results  Component Value Date   HGBA1C 6.7 (H) 12/26/2021   HGBA1C 6.4 (H) 09/25/2021    Assessment & Plan:   Mitchell Garza was seen today for medical management of chronic issues.  Diagnoses and all orders for this visit:  Diabetic lipidosis (Mitchell Garza) -     Bayer DCA Hb A1c Waived -     CBC with Differential/Platelet -     CMP14+EGFR -     Lipid panel  Crohn's disease of large intestine without complication (Mitchell Garza) -     CBC with Differential/Platelet -     CMP14+EGFR -     Lipid panel  Other orders -     benzonatate (TESSALON) 200 MG capsule; Take 1 capsule (200 mg total) by mouth 3 (three) times daily as needed for cough. -     traZODone (DESYREL) 150 MG tablet; Use from 1/3 to 1 tablet nightly as needed for sleep. -     blood glucose meter kit and supplies KIT; Dispense based on patient and insurance preference. Use up to four times daily as directed. (FOR ICD-10 : E11.9   I have discontinued Mitchell Garza's amoxicillin-clavulanate and Cheratussin AC. [name] start on benzonatate, traZODone, and blood glucose meter kit and supplies. Additionally, I am having him maintain his inFLIXimab, glimepiride, lisinopril, metFORMIN, pioglitazone, sildenafil, simvastatin, Chantix Starting Month Pak, and varenicline.  Meds ordered this encounter  Medications   benzonatate (TESSALON) 200 MG capsule     Sig: Take 1 capsule (200 mg total) by mouth 3 (three) times daily as needed for cough.    Dispense:  30 capsule    Refill:  1   traZODone (DESYREL) 150 MG tablet    Sig: Use from 1/3 to 1 tablet nightly as needed for sleep.    Dispense:  30 tablet    Refill:  5   blood glucose meter kit and supplies KIT    Sig: Dispense based on patient and insurance preference. Use up to four times daily as directed. (FOR ICD-10 : E11.9    Dispense:  1 each    Refill:  9    Order Specific Question:  Number of strips    Answer:   100    Order Specific Question:   Number of lancets    Answer:   100     Follow-up: Return in about 3 months (around 07/02/2022), or if symptoms worsen or fail to improve, for diabetes.  Claretta Fraise, M.D.

## 2022-04-02 LAB — CBC WITH DIFFERENTIAL/PLATELET
Basophils Absolute: 0 10*3/uL (ref 0.0–0.2)
Basos: 1 %
EOS (ABSOLUTE): 0.2 10*3/uL (ref 0.0–0.4)
Eos: 3 %
Hematocrit: 41.1 % (ref 37.5–51.0)
Hemoglobin: 13.7 g/dL (ref 13.0–17.7)
Immature Grans (Abs): 0 10*3/uL (ref 0.0–0.1)
Immature Granulocytes: 0 %
Lymphocytes Absolute: 2.3 10*3/uL (ref 0.7–3.1)
Lymphs: 33 %
MCH: 30.5 pg (ref 26.6–33.0)
MCHC: 33.3 g/dL (ref 31.5–35.7)
MCV: 92 fL (ref 79–97)
Monocytes Absolute: 0.5 10*3/uL (ref 0.1–0.9)
Monocytes: 8 %
Neutrophils Absolute: 3.9 10*3/uL (ref 1.4–7.0)
Neutrophils: 55 %
Platelets: 267 10*3/uL (ref 150–450)
RBC: 4.49 x10E6/uL (ref 4.14–5.80)
RDW: 12.9 % (ref 11.6–15.4)
WBC: 6.9 10*3/uL (ref 3.4–10.8)

## 2022-04-02 LAB — CMP14+EGFR
ALT: 27 IU/L (ref 0–44)
AST: 24 IU/L (ref 0–40)
Albumin/Globulin Ratio: 1.6 (ref 1.2–2.2)
Albumin: 4.1 g/dL (ref 3.8–4.9)
Alkaline Phosphatase: 51 IU/L (ref 44–121)
BUN/Creatinine Ratio: 12 (ref 10–24)
BUN: 10 mg/dL (ref 8–27)
Bilirubin Total: 0.4 mg/dL (ref 0.0–1.2)
CO2: 25 mmol/L (ref 20–29)
Calcium: 9.1 mg/dL (ref 8.6–10.2)
Chloride: 100 mmol/L (ref 96–106)
Creatinine, Ser: 0.83 mg/dL (ref 0.76–1.27)
Globulin, Total: 2.5 g/dL (ref 1.5–4.5)
Glucose: 108 mg/dL — ABNORMAL HIGH (ref 70–99)
Potassium: 4.2 mmol/L (ref 3.5–5.2)
Sodium: 139 mmol/L (ref 134–144)
Total Protein: 6.6 g/dL (ref 6.0–8.5)
eGFR: 100 mL/min/{1.73_m2} (ref >59)

## 2022-04-02 LAB — LIPID PANEL
Chol/HDL Ratio: 2 ratio (ref 0.0–5.0)
Cholesterol, Total: 121 mg/dL (ref 100–199)
HDL: 60 mg/dL (ref >39)
LDL Chol Calc (NIH): 43 mg/dL (ref 0–99)
Triglycerides: 99 mg/dL (ref 0–149)
VLDL Cholesterol Cal: 18 mg/dL (ref 5–40)

## 2022-04-02 NOTE — Progress Notes (Signed)
Hello Quasean,  Your lab result is normal and/or stable.Some minor variations that are not significant are commonly marked abnormal, but do not represent any medical problem for you.  Best regards, Claretta Fraise, M.D.

## 2022-04-23 ENCOUNTER — Other Ambulatory Visit: Payer: Self-pay | Admitting: Family Medicine

## 2022-04-23 NOTE — Telephone Encounter (Signed)
Medication was refill on 04/01/22 for 30 tablets with 5 refills.  Pharmacy would like to change to a 90 day prescription with 90 tablets and 1 refill if that is okay with you.

## 2022-04-24 ENCOUNTER — Other Ambulatory Visit: Payer: Self-pay | Admitting: Family Medicine

## 2022-04-24 ENCOUNTER — Telehealth: Payer: Self-pay | Admitting: Adult Health

## 2022-04-24 NOTE — Telephone Encounter (Signed)
LVM and sent mychart msg informing pt of r/s needed for 7/19 appt- MD out.

## 2022-05-07 ENCOUNTER — Encounter: Payer: BC Managed Care – PPO | Admitting: Adult Health

## 2022-05-23 ENCOUNTER — Other Ambulatory Visit: Payer: Self-pay | Admitting: Family Medicine

## 2022-05-26 ENCOUNTER — Other Ambulatory Visit: Payer: Self-pay | Admitting: Family Medicine

## 2022-05-29 ENCOUNTER — Ambulatory Visit: Payer: BC Managed Care – PPO | Admitting: Adult Health

## 2022-05-29 ENCOUNTER — Encounter: Payer: Self-pay | Admitting: Adult Health

## 2022-05-29 VITALS — BP 119/74 | HR 75 | Ht 71.0 in | Wt 265.1 lb

## 2022-05-29 DIAGNOSIS — Z9989 Dependence on other enabling machines and devices: Secondary | ICD-10-CM

## 2022-05-29 DIAGNOSIS — G4733 Obstructive sleep apnea (adult) (pediatric): Secondary | ICD-10-CM | POA: Diagnosis not present

## 2022-05-29 NOTE — Progress Notes (Signed)
[facility] [address] [place]. [place] [address] [phone]       OFFICE FOLLOW UP NOTE  Mitchell Garza Date of Birth:  January 12, 1961 Medical Record Number:  [id number]   Reason for visit: Initial CPAP follow-up    SUBJECTIVE:   CHIEF COMPLAINT:  Chief Complaint  Patient presents with   Obstructive Sleep Apnea    Pt is pretty good. He states he has some nights where he just cant use it but that's being less frequent. He reports that his machine stopped sending feedback to his phone. Room 2 alone    HPI:   Update 05/29/2022 JM: Patient returns for initial CPAP compliance visit.  Completed HST 02/19/2022 which showed moderate OSA with total AHI of 29.4/h and O2 nadir of 82%.  Recommend initiation of AutoPap which was started on 03/25/2022.  Reports he has been gradually improving tolerance but still has some nights where he may only be able to tolerate for a couple hours.  He still has some issues with insomnia but not as bad when sleeping in recliner.  He also takes trazodone nightly which helps.  He does endorse great improvement of daytime fatigue and sleep quality.  Currently using nose pillow, will wake up with air coming from his mouth at times, not bothersome, no other issues with leaks.  He has tried chinstrap without benefit.  He has tried use of a full facemask before and had great difficulty tolerating.   Epworth Sleepiness Scale 5/24 (prior 7/24).   Fatigue severity scale 27/63 (prior 35/63).           History provided for reference purposes only Consult visit 11/20/2021 Dr. Rexene Garza: [name] is a 61 year old right-handed gentleman with an underlying medical history of diabetes, diabetic retinopathy, Crohn's disease, and obesity, who was previously diagnosed with obstructive sleep apnea and placed on positive airway pressure treatment.  He no longer has a machine and has not used a PAP machine in about 3 years.  He moved recently from [place]  state.  Prior sleep study results are not available for my review today, he had testing out of state [place] years [place].  As per his recollection he had a home sleep test that showed severe sleep apnea at the time.  He had difficulty tolerating positive airway pressure particularly with a different masks he tried and with the pressure.  He is not resting well and has recently struggled with difficulty falling asleep and staying asleep.  He has had nighttime anxiety.  He has found that sleeping in the recliner helps him a little bit to obtain more sleep.  I reviewed your office note from 09/26/2022.  His Epworth sleepiness score is 7 out of 24, fatigue severity score is 35 out of 63.  He has gained weight in the past year, in the realm of 30 to 40 pounds.  He is a retired Engineer, structural. Some 5 years ago and is wife is concerned about his snoring and sleep apnea.  He drinks quite a bit of caffeine in the form of coffee, about 4 to 5 cups before 11 AM and tea about 2 to 3 glasses/day.  He has a history of bruxism and has a dentist may need bite guard for the past couple years.  He has 2 sons from his previous marriage and has 3 stepchildren who are also grown and live in [place] state.  He lives with his wife who works for the school system.  They have 1  dog in the household.  Bedtime is generally between 11 and midnight and rise time is around 8 if he can sleep in the recliner.  If he were to sleep in the bed, he is consistently up before 3 AM.  He has nocturia about once per average night.  Does not have any recurrent morning headaches.  He smokes about a pack to a pack and 1/2 per day.  He would like to eventually quit.  He drinks alcohol very occasionally.       ROS:   14 system review of systems performed and negative with exception of those listed in HPI  PMH:  Past Medical History:  Diagnosis Date   Crohn's disease ([place])    Diabetes mellitus without complication ([place])    Sleep apnea     PSH:   Past Surgical History:  Procedure Laterality Date   arthroscopic knee surgery Bilateral     Social History:  Social History   Socioeconomic History   Marital status: Married    Spouse name: Mitchell Garza   Number of children: 1   Years of education: Policeman   Highest education level: Not on file  Occupational History   Occupation: Retired  Tobacco Use   Smoking status: Every Day    Packs/day: 1.00    Years: 30.00    Total pack years: 30.00    Types: Cigarettes   Smokeless tobacco: Never  Vaping Use   Vaping Use: Never used  Substance and Sexual Activity   Alcohol use: Not Currently    Comment: rare   Drug use: Never   Sexual activity: Not on file  Other Topics Concern   Not on file  Social History Narrative   Right handed   Caffeine 5 cups per day    Social Determinants of Health   Financial Resource Strain: Not on file  Food Insecurity: Not on file  Transportation Needs: Not on file  Physical Activity: Not on file  Stress: Not on file  Social Connections: Not on file  Intimate Partner Violence: Not on file    Family History:  Family History  Problem Relation Age of Onset   Alcohol abuse Mother    Diabetes Mother    Cancer Father    Diabetes Sister    Diabetes Brother     Medications:   Current Outpatient Medications on File Prior to Visit  Medication Sig Dispense Refill   blood glucose meter kit and supplies KIT Dispense based on patient and insurance preference. Use up to four times daily as directed. (FOR ICD-10 : E11.9 1 each 9   glimepiride (AMARYL) 4 MG tablet Take 1 tablet (4 mg total) by mouth daily. 90 tablet 3   inFLIXimab (REMICADE) 100 MG injection Inject into the vein every 6 (six) weeks.     lisinopril (ZESTRIL) 5 MG tablet TAKE 1 TABLET (5 MG TOTAL) BY MOUTH DAILY. 90 tablet 0   metFORMIN (GLUCOPHAGE-XR) 500 MG 24 hr tablet TAKE 4 TABLETS BY MOUTH DAILY WITH BREAKFAST 360 tablet 0   pioglitazone (ACTOS) 45 MG tablet Take 1 tablet (45 mg total)  by mouth daily. 90 tablet 3   sildenafil (REVATIO) 20 MG tablet Take 1 tablet (20 mg total) by mouth as needed. 10 tablet 6   simvastatin (ZOCOR) 40 MG tablet Take 1 tablet (40 mg total) by mouth daily. 90 tablet 3   traZODone (DESYREL) 150 MG tablet USE FROM 1/3 TO 1 TABLET NIGHTLY AS NEEDED FOR SLEEP. 90 tablet 1  Varenicline Tartrate, Starter, (CHANTIX STARTING MONTH PAK) 0.5 MG X 11 & 1 MG X 42 TBPK Take 1 tablet by mouth 2 (two) times daily. Use according to package directions 1 each 0   benzonatate (TESSALON) 200 MG capsule Take 1 capsule (200 mg total) by mouth 3 (three) times daily as needed for cough. (Patient not taking: Reported on 05/29/2022) 30 capsule 1   varenicline (CHANTIX CONTINUING MONTH PAK) 1 MG tablet Take 1 tablet (1 mg total) by mouth 2 (two) times daily. (Patient not taking: Reported on 05/29/2022) 60 tablet 5   No current facility-administered medications on file prior to visit.    Allergies:  No Known Allergies    OBJECTIVE:  Physical Exam  Vitals:   05/29/22 1540  BP: 119/74  Pulse: 75  Weight: 265 lb 2 oz (120.3 kg)  Height: _0  (1.803 m)   Body mass index is 36.98 kg/m. No results found.  General: well developed, well nourished, very pleasant middle-age Caucasian male, seated, in no evident distress Head: head normocephalic and atraumatic.   Neck: supple with no carotid or supraclavicular bruits Cardiovascular: regular rate and rhythm, no murmurs Musculoskeletal: no deformity Skin:  no rash/petichiae Vascular:  Normal pulses all extremities   Neurologic Exam Mental Status: Awake and fully alert. Oriented to place and time. Recent and remote memory intact. Attention span, concentration and fund of knowledge appropriate. Mood and affect appropriate.  Cranial Nerves: Pupils equal, briskly reactive to light. Extraocular movements full without nystagmus. Visual fields full to confrontation. Hearing intact. Facial sensation intact. Face, tongue,  palate moves normally and symmetrically.  Motor: Normal bulk and tone. Normal strength in all tested extremity muscles Sensory.: intact to touch , pinprick , position and vibratory sensation.  Coordination: Rapid alternating movements normal in all extremities. Finger-to-nose and heel-to-shin performed accurately bilaterally. Gait and Station: Arises from chair without difficulty. Stance is normal. Gait demonstrates normal stride length and balance without use of AD. Tandem walk and heel toe without difficulty.  Reflexes: 1+ and symmetric. Toes downgoing.         ASSESSMENT/PLAN: Walther Sanagustin is a 61 y.o. year old male   OSA on CPAP : Compliance report shows satisfactory usage with optimal residual AHI.  Continue current settings.  Has been gradually improving tolerance, discussed ways to further help improve tolerance.  Advised to reach out to DME company if he would like to try a different mask such as a hybrid mask which can help reduce leaks as he is a mouth breather. Discussed continued nightly usage with ensuring greater than 4 hours nightly for optimal benefit and per insurance purposes but preferably use throughout the entire duration of sleeping as well as daytime naps.  Continue to follow with DME company for any needed supplies or CPAP related concerns     Follow up in 6 months or call earlier if needed   CC:  PCP: Claretta Fraise, MD    I spent 26 minutes of face-to-face and non-face-to-face time with patient.  This included previsit chart review, lab review, study review, order entry, electronic health record documentation, patient education regarding sleep apnea with review and discussion of compliance report and answered all other questions to patient's satisfaction   Frann Rider, [facility]  [facility] [address] [place], [place] [address]  Phone [phone] Fax [phone] Note: This document was prepared with digital  dictation and possible smart phrase technology. Any transcriptional errors that result from this process are unintentional.

## 2022-05-29 NOTE — Patient Instructions (Signed)
Continue use of CPAP at current settings - you can try to use in the evening while sitting and watching TV for 15 to 30 minutes as this can help improve tolerance. Please use anytime that you are sleeping, even for daytime naps.  If you continue to notice air coming out of your mouth, consider trying a hybrid mask. Plan to follow up in 6 months or call earlier if needed.

## 2022-06-02 ENCOUNTER — Other Ambulatory Visit: Payer: Self-pay | Admitting: Family Medicine

## 2022-06-03 ENCOUNTER — Other Ambulatory Visit: Payer: Self-pay | Admitting: Family Medicine

## 2022-06-21 ENCOUNTER — Other Ambulatory Visit: Payer: Self-pay | Admitting: Family Medicine

## 2022-06-30 ENCOUNTER — Encounter: Payer: Self-pay | Admitting: Family Medicine

## 2022-07-02 ENCOUNTER — Encounter: Payer: Self-pay | Admitting: Family Medicine

## 2022-07-02 ENCOUNTER — Ambulatory Visit: Payer: BC Managed Care – PPO | Admitting: Family Medicine

## 2022-07-02 VITALS — BP 116/66 | HR 86 | Temp 97.5°F | Ht 71.0 in | Wt 262.8 lb

## 2022-07-02 DIAGNOSIS — E1169 Type 2 diabetes mellitus with other specified complication: Secondary | ICD-10-CM

## 2022-07-02 DIAGNOSIS — E119 Type 2 diabetes mellitus without complications: Secondary | ICD-10-CM

## 2022-07-02 DIAGNOSIS — E756 Lipid storage disorder, unspecified: Secondary | ICD-10-CM

## 2022-07-02 DIAGNOSIS — Z23 Encounter for immunization: Secondary | ICD-10-CM | POA: Diagnosis not present

## 2022-07-02 LAB — BAYER DCA HB A1C WAIVED: HB A1C (BAYER DCA - WAIVED): 6.3 % — ABNORMAL HIGH (ref 4.8–5.6)

## 2022-07-02 MED ORDER — GLIMEPIRIDE 4 MG PO TABS: 2.0000 mg | ORAL_TABLET | Freq: Every day | ORAL | 1 refills | Status: DC

## 2022-07-02 MED ORDER — TRAZODONE HCL 150 MG PO TABS: ORAL_TABLET | ORAL | 3 refills | Status: DC

## 2022-07-02 NOTE — Progress Notes (Signed)
Subjective:  [name],  male    DOB: 1961-08-25  Age: 61 y.o.    CC: Medical Management of Chronic Issues   HPI Izic Stfort presents for Follow-up of diabetes. Patient does check blood sugar at home. Readings run between 110 fasting  and the same prandial.  Patient denies symptoms such as excessive hunger or urinary frequency, excessive hunger, nausea A few significant hypoglycemic spells noted. Medications reviewed. Pt reports taking them regularly. Pt. denies complication/adverse reaction today.    in for follow-up of elevated cholesterol. Doing well without complaints on current medication. Denies side effects of statin including myalgia and arthralgia and nausea. Currently no chest pain, shortness of breath or other cardiovascular related symptoms noted.   Smoking stopped June 1. Has had covid recently. Has a little cough left.  He has a past surgical history that includes arthroscopic knee surgery (Bilateral).   His family history includes Alcohol abuse in his mother; Cancer in his father; Diabetes in his brother, mother, and sister.He reports that he has been smoking cigarettes. He has a 30.00 pack-year smoking history. He has never used smokeless tobacco. He reports that he does not currently use alcohol. He reports that he does not use drugs.  Current Outpatient Medications on File Prior to Visit  Medication Sig Dispense Refill   blood glucose meter kit and supplies KIT Dispense based on patient and insurance preference. Use up to four times daily as directed. (FOR ICD-10 : E11.9 1 each 9   inFLIXimab (REMICADE) 100 MG injection Inject into the vein every 6 (six) weeks.     lisinopril (ZESTRIL) 5 MG tablet TAKE 1 TABLET (5 MG TOTAL) BY MOUTH DAILY. 90 tablet 0   metFORMIN (GLUCOPHAGE-XR) 500 MG 24 hr tablet TAKE 4 TABLETS BY MOUTH DAILY WITH BREAKFAST 360 tablet 0   pioglitazone (ACTOS) 45 MG tablet TAKE 1 TABLET BY MOUTH EVERY DAY 90 tablet 0   sildenafil  (REVATIO) 20 MG tablet Take 1 tablet (20 mg total) by mouth as needed. 10 tablet 6   simvastatin (ZOCOR) 40 MG tablet Take 1 tablet (40 mg total) by mouth daily. 90 tablet 3   Varenicline Tartrate, Starter, (CHANTIX STARTING MONTH PAK) 0.5 MG X 11 & 1 MG X 42 TBPK Take 1 tablet by mouth 2 (two) times daily. Use according to package directions 1 each 0   varenicline (CHANTIX CONTINUING MONTH PAK) 1 MG tablet Take 1 tablet (1 mg total) by mouth 2 (two) times daily. (Patient not taking: Reported on 05/29/2022) 60 tablet 5   No current facility-administered medications on file prior to visit.    ROS Review of Systems  Constitutional:  Negative for fever.  Respiratory:  Negative for shortness of breath.   Cardiovascular:  Negative for chest pain.  Musculoskeletal:  Negative for arthralgias.  Skin:  Negative for rash.    Objective:  BP 116/66   Pulse 86   Temp (!) 97.5 F (36.4 C)   Ht 5' 11"  (1.803 m)   Wt 262 lb 12.8 oz (119.2 kg)   SpO2 95%   BMI 36.65 kg/m   BP Readings from Last 3 Encounters:  07/02/22 116/66  05/29/22 119/74  04/01/22 133/75    Wt Readings from Last 3 Encounters:  07/02/22 262 lb 12.8 oz (119.2 kg)  05/29/22 265 lb 2 oz (120.3 kg)  04/01/22 265 lb 12.8 oz (120.6 kg)     Physical Exam Vitals reviewed.  Constitutional:      Appearance:  He is well-developed.  HENT:     Head: Normocephalic and atraumatic.     Right Ear: External ear normal.     Left Ear: External ear normal.     Mouth/Throat:     Pharynx: No oropharyngeal exudate or posterior oropharyngeal erythema.  Eyes:     Pupils: Pupils are equal, round, and reactive to light.  Cardiovascular:     Rate and Rhythm: Normal rate and regular rhythm.     Heart sounds: No murmur heard. Pulmonary:     Effort: No respiratory distress.     Breath sounds: Normal breath sounds.  Musculoskeletal:     Cervical back: Normal range of motion and neck supple.  Neurological:     Mental Status: He is alert  and oriented to person, place, and time.     Diabetic Foot Exam - Simple   No data filed     Lab Results  Component Value Date   HGBA1C 6.8 (H) 04/01/2022   HGBA1C 6.7 (H) 12/26/2021   HGBA1C 6.4 (H) 09/25/2021    Assessment & Plan:   Kier was seen today for medical management of chronic issues.  Diagnoses and all orders for this visit:  Diabetes mellitus without complication ([place]) -     Bayer DCA Hb A1c Waived -     CBC with Differential/Platelet -     CMP14+EGFR  Diabetic lipidosis ([facility]) -     Lipid panel  Need for shingles vaccine -     Zoster Recombinant (Shingrix )  Other orders -     traZODone (DESYREL) 150 MG tablet; USE FROM 1/3 TO 1 TABLET NIGHTLY AS NEEDED FOR SLEEP. -     glimepiride (AMARYL) 4 MG tablet; Take 0.5 tablets (2 mg total) by mouth daily.   I have discontinued Ianmichael Launer's benzonatate. I have also changed his glimepiride. Additionally, I am having him maintain his inFLIXimab, sildenafil, simvastatin, Chantix Starting Month Pak, varenicline, blood glucose meter kit and supplies, metFORMIN, lisinopril, pioglitazone, and traZODone.  Meds ordered this encounter  Medications   traZODone (DESYREL) 150 MG tablet    Sig: USE FROM 1/3 TO 1 TABLET NIGHTLY AS NEEDED FOR SLEEP.    Dispense:  90 tablet    Refill:  3   glimepiride (AMARYL) 4 MG tablet    Sig: Take 0.5 tablets (2 mg total) by mouth daily.    Dispense:  45 tablet    Refill:  1     Follow-up: Return in about 3 months (around 10/01/2022).  Claretta Fraise, M.D.

## 2022-07-03 LAB — LIPID PANEL
Chol/HDL Ratio: 1.8 ratio (ref 0.0–5.0)
Cholesterol, Total: 122 mg/dL (ref 100–199)
HDL: 69 mg/dL (ref >39)
LDL Chol Calc (NIH): 40 mg/dL (ref 0–99)
Triglycerides: 61 mg/dL (ref 0–149)
VLDL Cholesterol Cal: 13 mg/dL (ref 5–40)

## 2022-07-03 LAB — CBC WITH DIFFERENTIAL/PLATELET
Basophils Absolute: 0 10*3/uL (ref 0.0–0.2)
Basos: 1 %
EOS (ABSOLUTE): 0.2 10*3/uL (ref 0.0–0.4)
Eos: 3 %
Hematocrit: 39.1 % (ref 37.5–51.0)
Hemoglobin: 12.8 g/dL — ABNORMAL LOW (ref 13.0–17.7)
Immature Grans (Abs): 0 10*3/uL (ref 0.0–0.1)
Immature Granulocytes: 0 %
Lymphocytes Absolute: 1.9 10*3/uL (ref 0.7–3.1)
Lymphs: 36 %
MCH: 30.8 pg (ref 26.6–33.0)
MCHC: 32.7 g/dL (ref 31.5–35.7)
MCV: 94 fL (ref 79–97)
Monocytes Absolute: 0.4 10*3/uL (ref 0.1–0.9)
Monocytes: 8 %
Neutrophils Absolute: 2.7 10*3/uL (ref 1.4–7.0)
Neutrophils: 52 %
Platelets: 238 10*3/uL (ref 150–450)
RBC: 4.16 x10E6/uL (ref 4.14–5.80)
RDW: 13 % (ref 11.6–15.4)
WBC: 5.2 10*3/uL (ref 3.4–10.8)

## 2022-07-03 LAB — CMP14+EGFR
ALT: 31 IU/L (ref 0–44)
AST: 21 IU/L (ref 0–40)
Albumin/Globulin Ratio: 1.7 (ref 1.2–2.2)
Albumin: 4.3 g/dL (ref 3.8–4.9)
Alkaline Phosphatase: 46 IU/L (ref 44–121)
BUN/Creatinine Ratio: 15 (ref 10–24)
BUN: 15 mg/dL (ref 8–27)
Bilirubin Total: 0.6 mg/dL (ref 0.0–1.2)
CO2: 25 mmol/L (ref 20–29)
Calcium: 9.7 mg/dL (ref 8.6–10.2)
Chloride: 104 mmol/L (ref 96–106)
Creatinine, Ser: 0.97 mg/dL (ref 0.76–1.27)
Globulin, Total: 2.6 g/dL (ref 1.5–4.5)
Glucose: 123 mg/dL — ABNORMAL HIGH (ref 70–99)
Potassium: 5.3 mmol/L — ABNORMAL HIGH (ref 3.5–5.2)
Sodium: 143 mmol/L (ref 134–144)
Total Protein: 6.9 g/dL (ref 6.0–8.5)
eGFR: 89 mL/min/{1.73_m2} (ref >59)

## 2022-07-03 NOTE — Progress Notes (Signed)
Hello Bra[place],  Your lab result is normal and/or stable.Some minor variations that are not significant are commonly marked abnormal, but do not represent any medical problem for you.  Best regards, Claretta Fraise, M.D.

## 2022-07-24 ENCOUNTER — Other Ambulatory Visit: Payer: Self-pay | Admitting: Family Medicine

## 2022-08-23 ENCOUNTER — Other Ambulatory Visit: Payer: Self-pay | Admitting: Family Medicine

## 2022-08-30 ENCOUNTER — Other Ambulatory Visit: Payer: Self-pay | Admitting: Family Medicine

## 2022-10-01 ENCOUNTER — Encounter: Payer: Self-pay | Admitting: Family Medicine

## 2022-10-01 ENCOUNTER — Ambulatory Visit: Payer: BC Managed Care – PPO | Admitting: Family Medicine

## 2022-10-01 VITALS — BP 128/73 | HR 73 | Temp 98.4°F | Ht 71.0 in | Wt 260.8 lb

## 2022-10-01 DIAGNOSIS — E119 Type 2 diabetes mellitus without complications: Secondary | ICD-10-CM

## 2022-10-01 DIAGNOSIS — Z13220 Encounter for screening for lipoid disorders: Secondary | ICD-10-CM

## 2022-10-01 DIAGNOSIS — Z23 Encounter for immunization: Secondary | ICD-10-CM | POA: Diagnosis not present

## 2022-10-01 LAB — BAYER DCA HB A1C WAIVED: HB A1C (BAYER DCA - WAIVED): 7.5 % — ABNORMAL HIGH (ref 4.8–5.6)

## 2022-10-01 MED ORDER — TRULICITY 0.75 MG/0.5ML ~~LOC~~ SOAJ: 0.7500 mg | SUBCUTANEOUS | 0 refills | Status: DC

## 2022-10-01 NOTE — Progress Notes (Signed)
Subjective:  [name], male    DOB: 25-Jun-1961  Age: 61 y.o. MRN: [id number]  CC: Medical Management of Chronic Issues   HPI Mitchell Garza presents forFollow-up of diabetes. Patient checks blood sugar at home.   100-115 fasting and 140-145 postprandial Patient denies symptoms such as polyuria, polydipsia, excessive hunger, nausea No significant hypoglycemic spells noted.One or two minor spells covered by a snack.  Medications reviewed. Pt reports taking them regularly without complication/adverse reaction being reported today.  Tried going down on to 2 mg on the amaryl. Was as high as 300. Went back to 4 mg aand now fasting is 100-115.  History Mitchell Garza has a past medical history of Crohn's disease (Mitchell Garza), Diabetes mellitus without complication (Mitchell Garza), and Sleep apnea.   He has a past surgical history that includes arthroscopic knee surgery (Bilateral).   His family history includes Alcohol abuse in his mother; Cancer in his father; Diabetes in his brother, mother, and sister.He reports that he has been smoking cigarettes. He has a 30.00 pack-year smoking history. He has never used smokeless tobacco. He reports that he does not currently use alcohol. He reports that he does not use drugs.  Current Outpatient Medications on File Prior to Visit  Medication Sig Dispense Refill   blood glucose meter kit and supplies KIT Dispense based on patient and insurance preference. Use up to four times daily as directed. (FOR ICD-10 : E11.9 1 each 9   glimepiride (AMARYL) 4 MG tablet Take 0.5 tablets (2 mg total) by mouth daily. (Patient taking differently: Take 4 mg by mouth daily.) 45 tablet 1   inFLIXimab (REMICADE) 100 MG injection Inject into the vein every 6 (six) weeks.     lisinopril (ZESTRIL) 5 MG tablet TAKE 1 TABLET (5 MG TOTAL) BY MOUTH DAILY. 90 tablet 0   metFORMIN (GLUCOPHAGE-XR) 500 MG 24 hr tablet TAKE 4 TABLETS BY MOUTH DAILY WITH BREAKFAST 360 tablet 0   pioglitazone  (ACTOS) 45 MG tablet TAKE 1 TABLET BY MOUTH EVERY DAY 90 tablet 0   sildenafil (REVATIO) 20 MG tablet Take 1 tablet (20 mg total) by mouth as needed. 10 tablet 6   simvastatin (ZOCOR) 40 MG tablet TAKE 1 TABLET BY MOUTH EVERY DAY 90 tablet 0   traZODone (DESYREL) 150 MG tablet USE FROM 1/3 TO 1 TABLET NIGHTLY AS NEEDED FOR SLEEP. 90 tablet 3   varenicline (CHANTIX CONTINUING MONTH PAK) 1 MG tablet Take 1 tablet (1 mg total) by mouth 2 (two) times daily. 60 tablet 5   Varenicline Tartrate, Starter, (CHANTIX STARTING MONTH PAK) 0.5 MG X 11 & 1 MG X 42 TBPK Take 1 tablet by mouth 2 (two) times daily. Use according to package directions 1 each 0   No current facility-administered medications on file prior to visit.    ROS Review of Systems  Constitutional:  Negative for fever.  Respiratory:  Negative for shortness of breath.   Cardiovascular:  Negative for chest pain.  Musculoskeletal:  Negative for arthralgias.  Skin:  Negative for rash.    Objective:  BP 128/73   Pulse 73   Temp 98.4 F (36.9 C)   Ht _0  (1.803 m)   Wt 260 lb 12.8 oz (118.3 kg)   SpO2 96%   BMI 36.37 kg/m   BP Readings from Last 3 Encounters:  10/01/22 128/73  07/02/22 116/66  05/29/22 119/74    Wt Readings from Last 3 Encounters:  10/01/22 260 lb 12.8 oz (118.3 kg)  07/02/22 262 lb 12.8 oz (119.2 kg)  05/29/22 265 lb 2 oz (120.3 kg)     Physical Exam Vitals reviewed.  Constitutional:      Appearance: He is well-developed.  HENT:     Head: Normocephalic and atraumatic.     Right Ear: External ear normal.     Left Ear: External ear normal.     Mouth/Throat:     Pharynx: No oropharyngeal exudate or posterior oropharyngeal erythema.  Eyes:     Pupils: Pupils are equal, round, and reactive to light.  Cardiovascular:     Rate and Rhythm: Normal rate and regular rhythm.     Heart sounds: No murmur heard. Pulmonary:     Effort: No respiratory distress.     Breath sounds: Normal breath sounds.   Musculoskeletal:     Cervical back: Normal range of motion and neck supple.  Neurological:     Mental Status: He is alert and oriented to person, place, and time.       Assessment & Plan:   Mitchell Garza was seen today for medical management of chronic issues.  Diagnoses and all orders for this visit:  Diabetes mellitus without complication ([place]) -     Bayer DCA Hb A1c Waived -     CBC with Differential/Platelet -     CMP14+EGFR -     Microalbumin / creatinine urine ratio  Lipid screening -     Lipid panel  Need for immunization against influenza -     Flu Vaccine QUAD 58moIM (Fluarix, Fluzone & Alfiuria Quad PF)  Need for shingles vaccine -     Zoster Recombinant (Shingrix )  Other orders -     Dulaglutide (TRULICITY) 06.38MLH/7.3SKSOPN; Inject 0.75 mg into the skin once a week.      I am having Mitchell Anchorsstart on Trulicity. [name] maintain his inFLIXimab, sildenafil, Chantix Starting Month Pak, varenicline, blood glucose meter kit and supplies, metFORMIN, traZODone, glimepiride, simvastatin, lisinopril, and pioglitazone.  Meds ordered this encounter  Medications   Dulaglutide (TRULICITY) 08.76MOT/1.5BWSOPN    Sig: Inject 0.75 mg into the skin once a week.    Dispense:  2 mL    Refill:  0     Follow-up: Return in about 3 months (around 12/31/2022).  WClaretta Garza M.D.

## 2022-10-02 LAB — MICROALBUMIN / CREATININE URINE RATIO
Creatinine, Urine: 23.3 mg/dL
Microalb/Creat Ratio: 65 mg/g creat — ABNORMAL HIGH (ref 0–29)
Microalbumin, Urine: 15.2 ug/mL

## 2022-10-02 LAB — CBC WITH DIFFERENTIAL/PLATELET
Basophils Absolute: 0 10*3/uL (ref 0.0–0.2)
Basos: 1 %
EOS (ABSOLUTE): 0.2 10*3/uL (ref 0.0–0.4)
Eos: 3 %
Hematocrit: 40.5 % (ref 37.5–51.0)
Hemoglobin: 14 g/dL (ref 13.0–17.7)
Immature Grans (Abs): 0 10*3/uL (ref 0.0–0.1)
Immature Granulocytes: 0 %
Lymphocytes Absolute: 2 10*3/uL (ref 0.7–3.1)
Lymphs: 41 %
MCH: 31.2 pg (ref 26.6–33.0)
MCHC: 34.6 g/dL (ref 31.5–35.7)
MCV: 90 fL (ref 79–97)
Monocytes Absolute: 0.5 10*3/uL (ref 0.1–0.9)
Monocytes: 10 %
Neutrophils Absolute: 2.1 10*3/uL (ref 1.4–7.0)
Neutrophils: 45 %
Platelets: 231 10*3/uL (ref 150–450)
RBC: 4.49 x10E6/uL (ref 4.14–5.80)
RDW: 12 % (ref 11.6–15.4)
WBC: 4.8 10*3/uL (ref 3.4–10.8)

## 2022-10-02 LAB — CMP14+EGFR
ALT: 25 IU/L (ref 0–44)
AST: 22 IU/L (ref 0–40)
Albumin/Globulin Ratio: 1.6 (ref 1.2–2.2)
Albumin: 4.4 g/dL (ref 3.8–4.9)
Alkaline Phosphatase: 51 IU/L (ref 44–121)
BUN/Creatinine Ratio: 10 (ref 10–24)
BUN: 9 mg/dL (ref 8–27)
Bilirubin Total: 0.4 mg/dL (ref 0.0–1.2)
CO2: 25 mmol/L (ref 20–29)
Calcium: 9.4 mg/dL (ref 8.6–10.2)
Chloride: 101 mmol/L (ref 96–106)
Creatinine, Ser: 0.86 mg/dL (ref 0.76–1.27)
Globulin, Total: 2.8 g/dL (ref 1.5–4.5)
Glucose: 148 mg/dL — ABNORMAL HIGH (ref 70–99)
Potassium: 4.5 mmol/L (ref 3.5–5.2)
Sodium: 141 mmol/L (ref 134–144)
Total Protein: 7.2 g/dL (ref 6.0–8.5)
eGFR: 99 mL/min/{1.73_m2} (ref >59)

## 2022-10-02 LAB — LIPID PANEL
Chol/HDL Ratio: 2 ratio (ref 0.0–5.0)
Cholesterol, Total: 119 mg/dL (ref 100–199)
HDL: 59 mg/dL (ref >39)
LDL Chol Calc (NIH): 45 mg/dL (ref 0–99)
Triglycerides: 77 mg/dL (ref 0–149)
VLDL Cholesterol Cal: 15 mg/dL (ref 5–40)

## 2022-10-20 ENCOUNTER — Other Ambulatory Visit: Payer: Self-pay | Admitting: Family Medicine

## 2022-10-26 ENCOUNTER — Other Ambulatory Visit: Payer: Self-pay | Admitting: Family Medicine

## 2022-10-27 NOTE — Progress Notes (Signed)
Attempted to reach patient to scheduled follow-up LDCT. Unable to reach patient directly, detailed VM left asking that the patient return my call.   

## 2022-11-10 ENCOUNTER — Other Ambulatory Visit: Payer: Self-pay

## 2022-11-10 DIAGNOSIS — Z122 Encounter for screening for malignant neoplasm of respiratory organs: Secondary | ICD-10-CM

## 2022-11-10 DIAGNOSIS — Z87891 Personal history of nicotine dependence: Secondary | ICD-10-CM

## 2022-11-10 NOTE — Progress Notes (Signed)
LDCT order placed per protocol. Patient scheduled for 02/21 at 1200.

## 2022-11-23 ENCOUNTER — Other Ambulatory Visit: Payer: Self-pay | Admitting: Family Medicine

## 2022-11-27 NOTE — Progress Notes (Deleted)
[facility] [address] [place]. [place] [address] [phone]       OFFICE FOLLOW UP NOTE  [name] Date of Birth:  [birth year] Medical Record Number:  [id number]     Reason for visit: CPAP follow-up    SUBJECTIVE:   CHIEF COMPLAINT:  No chief complaint on file.   HPI:   Mitchell Garza is a 62 y.o. male who is being followed in this office for OSA on CPAP.  Initially seen by Dr. Rexene Alberts on 11/20/2021 with prior history of sleep apnea no longer on CPAP therapy. Completed HST 02/19/2022 which showed moderate OSA with total AHI of 29.4/h and O2 nadir of 82%.  Recommend initiation of AutoPap which was started on 03/25/2022.  Initial compliance visit 05/29/2022 which showed usage at 70% and optimal residual AHI, c/o issues still with insomnia and mask leaks.      Interval history:   Reports he has been gradually improving tolerance but still has some nights where he may only be able to tolerate for a couple hours.  He still has some issues with insomnia but not as bad when sleeping in recliner.  He also takes trazodone nightly which helps.  He does endorse great improvement of daytime fatigue and sleep quality.  Currently using nose pillow, will wake up with air coming from his mouth at times, not bothersome, no other issues with leaks.  He has tried chinstrap without benefit.  He has tried use of a full facemask before and had great difficulty tolerating.   Epworth Sleepiness Scale 5/24 (prior 7/24).   Fatigue severity scale 27/63 (prior 35/63).             ROS:   14 system review of systems performed and negative with exception of those listed in HPI  PMH:  Past Medical History:  Diagnosis Date   Crohn's disease ([place])    Diabetes mellitus without complication ([place])    Sleep apnea     PSH:  Past Surgical History:  Procedure Laterality Date   arthroscopic knee surgery Bilateral     Social History:  Social History   Socioeconomic  History   Marital status: Married    Spouse name: Amy   Number of children: 1   Years of education: Policeman   Highest education level: Not on file  Occupational History   Occupation: Retired  Tobacco Use   Smoking status: Every Day    Packs/day: 1.00    Years: 30.00    Total pack years: 30.00    Types: Cigarettes   Smokeless tobacco: Never  Vaping Use   Vaping Use: Never used  Substance and Sexual Activity   Alcohol use: Not Currently    Comment: rare   Drug use: Never   Sexual activity: Not on file  Other Topics Concern   Not on file  Social History Narrative   Right handed   Caffeine 5 cups per day    Social Determinants of Health   Financial Resource Strain: Not on file  Food Insecurity: Not on file  Transportation Needs: Not on file  Physical Activity: Not on file  Stress: Not on file  Social Connections: Not on file  Intimate Partner Violence: Not on file    Family History:  Family History  Problem Relation Age of Onset   Alcohol abuse Mother    Diabetes Mother    Cancer Father    Diabetes Sister    Diabetes Brother     Medications:  Current Outpatient Medications on File Prior to Visit  Medication Sig Dispense Refill   blood glucose meter kit and supplies KIT Dispense based on patient and insurance preference. Use up to four times daily as directed. (FOR ICD-10 : E11.9 1 each 9   Dulaglutide (TRULICITY) 5.17 OH/6.0VP SOPN INJECT 0.75 MG SUBCUTANEOUSLY ONE TIME PER WEEK 6 mL 0   glimepiride (AMARYL) 4 MG tablet Take 0.5 tablets (2 mg total) by mouth daily. (Patient taking differently: Take 4 mg by mouth daily.) 45 tablet 1   inFLIXimab (REMICADE) 100 MG injection Inject into the vein every 6 (six) weeks.     lisinopril (ZESTRIL) 5 MG tablet TAKE 1 TABLET (5 MG TOTAL) BY MOUTH DAILY. 90 tablet 0   metFORMIN (GLUCOPHAGE-XR) 500 MG 24 hr tablet TAKE 4 TABLETS BY MOUTH DAILY WITH BREAKFAST. 360 tablet 0   pioglitazone (ACTOS) 45 MG tablet TAKE 1 TABLET BY  MOUTH EVERY DAY 90 tablet 0   sildenafil (REVATIO) 20 MG tablet TAKE 1 TABLET BY MOUTH AS NEEDED 10 tablet 5   simvastatin (ZOCOR) 40 MG tablet TAKE 1 TABLET BY MOUTH EVERY DAY 90 tablet 0   traZODone (DESYREL) 150 MG tablet USE FROM 1/3 TO 1 TABLET NIGHTLY AS NEEDED FOR SLEEP. 90 tablet 3   varenicline (CHANTIX CONTINUING MONTH PAK) 1 MG tablet Take 1 tablet (1 mg total) by mouth 2 (two) times daily. 60 tablet 5   Varenicline Tartrate, Starter, (CHANTIX STARTING MONTH PAK) 0.5 MG X 11 & 1 MG X 42 TBPK Take 1 tablet by mouth 2 (two) times daily. Use according to package directions 1 each 0   No current facility-administered medications on file prior to visit.    Allergies:  No Known Allergies    OBJECTIVE:  Physical Exam  There were no vitals filed for this visit.  There is no height or weight on file to calculate BMI. No results found.  General: well developed, well nourished, very pleasant middle-age Caucasian male, seated, in no evident distress Head: head normocephalic and atraumatic.   Neck: supple with no carotid or supraclavicular bruits Cardiovascular: regular rate and rhythm, no murmurs Musculoskeletal: no deformity Skin:  no rash/petichiae Vascular:  Normal pulses all extremities   Neurologic Exam Mental Status: Awake and fully alert. Oriented to place and time. Recent and remote memory intact. Attention span, concentration and fund of knowledge appropriate. Mood and affect appropriate.  Cranial Nerves: Pupils equal, briskly reactive to light. Extraocular movements full without nystagmus. Visual fields full to confrontation. Hearing intact. Facial sensation intact. Face, tongue, palate moves normally and symmetrically.  Motor: Normal bulk and tone. Normal strength in all tested extremity muscles Sensory.: intact to touch , pinprick , position and vibratory sensation.  Coordination: Rapid alternating movements normal in all extremities. Finger-to-nose and heel-to-shin  performed accurately bilaterally. Gait and Station: Arises from chair without difficulty. Stance is normal. Gait demonstrates normal stride length and balance without use of AD. Tandem walk and heel toe without difficulty.  Reflexes: 1+ and symmetric. Toes downgoing.         ASSESSMENT/PLAN: Mitchell Garza is a 62 y.o. year old male   OSA on CPAP : Compliance report shows satisfactory usage with optimal residual AHI.  Continue current settings.  Has been gradually improving tolerance, discussed ways to further help improve tolerance.  Advised to reach out to DME company if he would like to try a different mask such as a hybrid mask which can help reduce leaks as he is  a mouth breather. Discussed continued nightly usage with ensuring greater than 4 hours nightly for optimal benefit and per insurance purposes but preferably use throughout the entire duration of sleeping as well as daytime naps.  Continue to follow with DME company for any needed supplies or CPAP related concerns     Follow up in 6 months or call earlier if needed   CC:  PCP: Claretta Fraise, MD    I spent 26 minutes of face-to-face and non-face-to-face time with patient.  This included previsit chart review, lab review, study review, order entry, electronic health record documentation, patient education regarding sleep apnea with review and discussion of compliance report and answered all other questions to patient's satisfaction   Frann Rider, [facility]  [facility] [address] [place], [place] [address]  Phone [phone] Fax [phone] Note: This document was prepared with digital dictation and possible smart phrase technology. Any transcriptional errors that result from this process are unintentional.

## 2022-12-01 ENCOUNTER — Ambulatory Visit: Payer: BC Managed Care – PPO | Admitting: Adult Health

## 2022-12-04 NOTE — Progress Notes (Signed)
[facility] [address] [place]. [place] [address] [phone]       OFFICE FOLLOW UP NOTE  [name] Date of Birth:  10/07/1961 Medical Record Number:  [id number]     Reason for visit: CPAP follow-up    SUBJECTIVE:   CHIEF COMPLAINT:  Chief Complaint  Patient presents with   Obstructive Sleep Apnea    RM 3 alone Pt is well, reports he has been having trouble recently with sleeping with CPAP. He can fall asleep well, but not stay asleep.     HPI:   Mitchell Garza is a 62 y.o. male who is being followed in this office for OSA on CPAP.  Initially seen by Dr. Rexene Alberts on 11/20/2021 with prior history of sleep apnea no longer on CPAP therapy. Completed HST 02/19/2022 which showed moderate OSA with total AHI of 29.4/h and O2 nadir of 82%.  Recommend initiation of AutoPap which was started on 03/25/2022.  Initial compliance visit 05/29/2022 which showed usage at 70% and optimal residual AHI, c/o issues still with insomnia and mask leaks.      Interval history:   Was initially seeing improvement in CPAP tolerance over the past 1 to 2 months, has been waking up after a couple hours of use, will start to feel anxious and will have difficulty falling back asleep until he removes his nasal pillow mask.  He initially is able about sleep without difficulty.  He does continue on trazodone, takes 1/3 of 130m tablet, has not tried higher dosage. He continues to experience improvement of daytime fatigue and energy levels with CPAP use.  He also mentions over the past 2 weeks , he has been getting alerts with elevated AHI levels.   Per review on [facility], slightly increased residual AHI over the past 2 weeks but also increased leak rate, he has not noticed any issues of weeks.   Epworth Sleepiness Scale 4/24 (prior 5/24).   Fatigue severity scale 24/63 (prior 27/63).             ROS:   14 system review of systems performed and negative with exception of those  listed in HPI  PMH:  Past Medical History:  Diagnosis Date   [place]    Di[place] ([place]    Sleep apnea     PSH:  Past Surgical History:  Procedure Laterality Date   arthroscopic knee surgery Bilateral     Social History:  Social History   Socioeconomic History   Marital status: Married    Spouse name: Amy   Number of children: 1   Years of education: Policeman   Highest education level: Not on file  Occupational History   Occupation: Retired  Tobacco Use   Smoking status: Every Day    Packs/day: 1.00    Years: 30.00    Total pack years: 30.00    Types: Cigarettes   Smokeless tobacco: Never  Vaping Use   Vaping Use: Never used  Substance and Sexual Activity   Alcohol use: Not Currently    Comment: rare   Drug use: Never   Sexual activity: Not on file  Other Topics Concern   Not on file  Social History Narrative   Right handed   Caffeine 5 cups per day    Social Determinants of Health   Financial Resource Strain: Not on file  Food Insecurity: Not on file  Transportation Needs: Not on file  Physical Activity: Not on file  Stress:  Not on file  Social Connections: Not on file  Intimate Partner Violence: Not on file    Family History:  Family History  Problem Relation Age of Onset   Alcohol abuse Mother    Diabetes Mother    Cancer Father    Diabetes Sister    Diabetes Brother     Medications:   Current Outpatient Medications on File Prior to Visit  Medication Sig Dispense Refill   blood glucose meter kit and supplies KIT Dispense based on patient and insurance preference. Use up to four times daily as directed. (FOR ICD-10 : E11.9 1 each 9   Dulaglutide (TRULICITY) [id number] [id number] SOPN INJECT 0.75 MG SUBCUTANEOUSLY ONE TIME PER WEEK 6 mL 0   glimepiride (AMARYL) 4 MG tablet Take 0.5 tablets (2 mg total) by mouth daily. (Patient taking differently: Take 4 mg by mouth daily.) 45 tablet 1   inFLIXimab  (REMICADE) 100 MG injection Inject into the vein every 6 (six) weeks.     lisinopril (ZESTRIL) 5 MG tablet TAKE 1 TABLET (5 MG TOTAL) BY MOUTH DAILY. 90 tablet 0   metFORMIN (GLUCOPHAGE-XR) 500 MG 24 hr tablet TAKE 4 TABLETS BY MOUTH DAILY WITH BREAKFAST. 360 tablet 0   pioglitazone (ACTOS) 45 MG tablet TAKE 1 TABLET BY MOUTH EVERY DAY 90 tablet 0   sildenafil (REVATIO) 20 MG tablet TAKE 1 TABLET BY MOUTH AS NEEDED 10 tablet 5   simvastatin (ZOCOR) 40 MG tablet TAKE 1 TABLET BY MOUTH EVERY DAY 90 tablet 0   traZODone (DESYREL) 150 MG tablet USE FROM 1/3 TO 1 TABLET NIGHTLY AS NEEDED FOR SLEEP. 90 tablet 3   No current facility-administered medications on file prior to visit.    Allergies:  No Known Allergies    OBJECTIVE:  Physical Exam  Vitals:   12/08/22 1425 12/08/22 1429  BP: (!) 154/88 (!) 156/87  Pulse: (!) 106 (!) 14  Weight: 258 lb (117 kg)   Height: 5' 11"$  (1.803 m)     Body mass index is 35.98 kg/m. No results found.  General: well developed, well nourished, very pleasant middle-age Caucasian male, seated, in no evident distress Head: head normocephalic and atraumatic.   Neck: supple with no carotid or supraclavicular bruits Cardiovascular: regular rate and rhythm, no murmurs Musculoskeletal: no deformity Skin:  no rash/petichiae Vascular:  Normal pulses all extremities   Neurologic Exam Mental Status: Awake and fully alert. Oriented to place and time. Recent and remote memory intact. Attention span, concentration and fund of knowledge appropriate. Mood and affect appropriate.  Cranial Nerves: Pupils equal, briskly reactive to light. Extraocular movements full without nystagmus. Visual fields full to confrontation. Hearing intact. Facial sensation intact. Face, tongue, palate moves normally and symmetrically.  Motor: Normal bulk and tone. Normal strength in all tested extremity muscles Sensory.: intact to touch , pinprick , position and vibratory sensation.   Coordination: Rapid alternating movements normal in all extremities. Finger-to-nose and heel-to-shin performed accurately bilaterally. Gait and Station: Arises from chair without difficulty. Stance is normal. Gait demonstrates normal stride length and balance without use of AD.  Reflexes: 1+ and symmetric. Toes downgoing.         ASSESSMENT/PLAN: Carlosmanuel Priddy is a 62 y.o. year old male   OSA on CPAP : Compliance report shows suboptimal >4 hr usage although optimal residual AHI with use over the past 30 days, slightly increased residual AHI over the past 2 weeks suspect in setting of increased leak rate, will f/u with DME to further  discuss. Was advised to call if this continues to be an issue.  Discussed ways to help improve tolerance, could try increasing trazodone dosage currently managed by PCP.  Discussed continued nightly usage with ensuring greater than 4 hours nightly for optimal benefit and per insurance purposes but preferably use throughout the entire duration of sleeping as well as daytime naps.  Continue to follow with DME company for any needed supplies or CPAP related concerns    Follow up in 1 year or call earlier if needed   CC:  PCP: Claretta Fraise, MD    I spent 24 minutes of face-to-face and non-face-to-face time with patient.  This included previsit chart review, lab review, study review, order entry, electronic health record documentation, patient education regarding sleep apnea with review and discussion of compliance report and answered all other questions to patient's satisfaction   Frann Rider, [facility]  [facility] [address] [place], [place] [address]  Phone [phone] Fax [phone] Note: This document was prepared with digital dictation and possible smart phrase technology. Any transcriptional errors that result from this process are unintentional.

## 2022-12-08 ENCOUNTER — Ambulatory Visit: Payer: BC Managed Care – PPO | Admitting: Adult Health

## 2022-12-08 ENCOUNTER — Encounter: Payer: Self-pay | Admitting: Adult Health

## 2022-12-08 VITALS — BP 156/87 | HR 14 | Ht 71.0 in | Wt 258.0 lb

## 2022-12-08 DIAGNOSIS — G4733 Obstructive sleep apnea (adult) (pediatric): Secondary | ICD-10-CM | POA: Diagnosis not present

## 2022-12-10 ENCOUNTER — Ambulatory Visit (HOSPITAL_COMMUNITY): Payer: BC Managed Care – PPO

## 2022-12-31 ENCOUNTER — Ambulatory Visit: Payer: BC Managed Care – PPO | Admitting: Family Medicine

## 2022-12-31 ENCOUNTER — Encounter: Payer: Self-pay | Admitting: Family Medicine

## 2022-12-31 VITALS — BP 116/61 | HR 79 | Temp 98.1°F | Ht 71.0 in | Wt 259.8 lb

## 2022-12-31 DIAGNOSIS — E119 Type 2 diabetes mellitus without complications: Secondary | ICD-10-CM | POA: Diagnosis not present

## 2022-12-31 DIAGNOSIS — Z23 Encounter for immunization: Secondary | ICD-10-CM

## 2022-12-31 LAB — CMP14+EGFR
ALT: 28 IU/L (ref 0–44)
AST: 19 IU/L (ref 0–40)
Albumin/Globulin Ratio: 1.7 (ref 1.2–2.2)
Albumin: 4.1 g/dL (ref 3.8–4.9)
Alkaline Phosphatase: 51 IU/L (ref 44–121)
BUN/Creatinine Ratio: 12 (ref 10–24)
BUN: 11 mg/dL (ref 8–27)
Bilirubin Total: 0.5 mg/dL (ref 0.0–1.2)
CO2: 24 mmol/L (ref 20–29)
Calcium: 9.2 mg/dL (ref 8.6–10.2)
Chloride: 104 mmol/L (ref 96–106)
Creatinine, Ser: 0.94 mg/dL (ref 0.76–1.27)
Globulin, Total: 2.4 g/dL (ref 1.5–4.5)
Glucose: 98 mg/dL (ref 70–99)
Potassium: 4.7 mmol/L (ref 3.5–5.2)
Sodium: 143 mmol/L (ref 134–144)
Total Protein: 6.5 g/dL (ref 6.0–8.5)
eGFR: 93 mL/min/{1.73_m2} (ref >59)

## 2022-12-31 LAB — CBC WITH DIFFERENTIAL/PLATELET
Basophils Absolute: 0 10*3/uL (ref 0.0–0.2)
Basos: 1 %
EOS (ABSOLUTE): 0.2 10*3/uL (ref 0.0–0.4)
Eos: 3 %
Hematocrit: 37.4 % — ABNORMAL LOW (ref 37.5–51.0)
Hemoglobin: 12.2 g/dL — ABNORMAL LOW (ref 13.0–17.7)
Immature Grans (Abs): 0 10*3/uL (ref 0.0–0.1)
Immature Granulocytes: 0 %
Lymphocytes Absolute: 1.9 10*3/uL (ref 0.7–3.1)
Lymphs: 36 %
MCH: 30.4 pg (ref 26.6–33.0)
MCHC: 32.6 g/dL (ref 31.5–35.7)
MCV: 93 fL (ref 79–97)
Monocytes Absolute: 0.4 10*3/uL (ref 0.1–0.9)
Monocytes: 8 %
Neutrophils Absolute: 2.8 10*3/uL (ref 1.4–7.0)
Neutrophils: 52 %
Platelets: 253 10*3/uL (ref 150–450)
RBC: 4.01 x10E6/uL — ABNORMAL LOW (ref 4.14–5.80)
RDW: 13.2 % (ref 11.6–15.4)
WBC: 5.3 10*3/uL (ref 3.4–10.8)

## 2022-12-31 LAB — BAYER DCA HB A1C WAIVED: HB A1C (BAYER DCA - WAIVED): 5.7 % — ABNORMAL HIGH (ref 4.8–5.6)

## 2022-12-31 MED ORDER — TRULICITY 1.5 MG/0.5ML ~~LOC~~ SOAJ: SUBCUTANEOUS | 5 refills | Status: DC

## 2022-12-31 MED ORDER — PIOGLITAZONE HCL 45 MG PO TABS: 45.0000 mg | ORAL_TABLET | Freq: Every day | ORAL | 2 refills | Status: DC

## 2022-12-31 MED ORDER — METFORMIN HCL ER 500 MG PO TB24: ORAL_TABLET | ORAL | 2 refills | Status: DC

## 2022-12-31 MED ORDER — LISINOPRIL 10 MG PO TABS: 10.0000 mg | ORAL_TABLET | Freq: Every day | ORAL | 3 refills | Status: DC

## 2022-12-31 MED ORDER — SIMVASTATIN 40 MG PO TABS: 40.0000 mg | ORAL_TABLET | Freq: Every day | ORAL | 2 refills | Status: DC

## 2022-12-31 NOTE — Progress Notes (Signed)
Subjective:  [name],  male    DOB: 20-Jul-1961  Age: 62 y.o.    CC: Medical Management of Chronic Issues   HPI Mitchell Garza presents for  follow-up of hypertension. Patient has no history of headache chest pain or shortness of breath or recent cough. Patient also denies symptoms of TIA such as numbness weakness lateralizing. Patient denies side effects from medication. States taking it regularly.  Patient also  in for follow-up of elevated cholesterol. Doing well without complaints on current medication. Denies side effects  including myalgia and arthralgia and nausea. Also in today for liver function testing. Currently no chest pain, shortness of breath or other cardiovascular related symptoms noted.  Follow-up of diabetes. Patient does check blood sugar at home. Readings run between 105 fasting and 112 - 115 Patient denies symptoms such as excessive hunger or urinary frequency, excessive hunger, nausea No significant hypoglycemic spells noted. Medications reviewed. Pt reports taking them regularly. Pt. denies complication/adverse reaction today.    History Mitchell Garza has a past medical history of Crohn's disease ([place]), Diabetes mellitus without complication ([place]), and Sleep apnea.   He has a past surgical history that includes arthroscopic knee surgery (Bilateral).   His family history includes Alcohol abuse in his mother; Cancer in his father; Diabetes in his brother, mother, and sister.He reports that he has been smoking cigarettes. He has a 30.00 pack-year smoking history. He has never used smokeless tobacco. He reports that he does not currently use alcohol. He reports that he does not use drugs.  Current Outpatient Medications on File Prior to Visit  Medication Sig Dispense Refill   blood glucose meter kit and supplies KIT Dispense based on patient and insurance preference. Use up to four times daily as directed. (FOR ICD-10 : E11.9 1 each 9   inFLIXimab  (REMICADE) 100 MG injection Inject into the vein every 6 (six) weeks.     sildenafil (REVATIO) 20 MG tablet TAKE 1 TABLET BY MOUTH AS NEEDED 10 tablet 5   traZODone (DESYREL) 150 MG tablet USE FROM 1/3 TO 1 TABLET NIGHTLY AS NEEDED FOR SLEEP. 90 tablet 3   No current facility-administered medications on file prior to visit.    ROS Review of Systems  Constitutional:  Negative for fever.  Respiratory:  Negative for shortness of breath.   Cardiovascular:  Negative for chest pain.  Musculoskeletal:  Negative for arthralgias.  Skin:  Negative for rash.    Objective:  BP 116/61   Pulse 79   Temp 98.1 F (36.7 C)   Ht '5\' 11"'$  (1.803 m)   Wt 259 lb 12.8 oz (117.8 kg)   SpO2 92%   BMI 36.23 kg/m   BP Readings from Last 3 Encounters:  12/31/22 116/61  12/08/22 (!) 156/87  10/01/22 128/73    Wt Readings from Last 3 Encounters:  12/31/22 259 lb 12.8 oz (117.8 kg)  12/08/22 258 lb (117 kg)  10/01/22 260 lb 12.8 oz (118.3 kg)     Physical Exam Vitals reviewed.  Constitutional:      Appearance: He is well-developed.  HENT:     Head: Normocephalic and atraumatic.     Right Ear: External ear normal.     Left Ear: External ear normal.     Mouth/Throat:     Pharynx: No oropharyngeal exudate or posterior oropharyngeal erythema.  Eyes:     Pupils: Pupils are equal, round, and reactive to light.  Cardiovascular:     Rate and Rhythm: Normal  rate and regular rhythm.     Heart sounds: No murmur heard. Pulmonary:     Effort: No respiratory distress.     Breath sounds: Normal breath sounds.  Musculoskeletal:     Cervical back: Normal range of motion and neck supple.  Neurological:     Mental Status: He is alert and oriented to person, place, and time.     Diabetic Foot Exam - Simple   No data filed     Lab Results  Component Value Date   HGBA1C 7.5 (H) 10/01/2022   HGBA1C 6.3 (H) 07/02/2022   HGBA1C 6.8 (H) 04/01/2022    Assessment & Plan:   Mitchell Garza was seen today  for medical management of chronic issues.  Diagnoses and all orders for this visit:  Diabetes mellitus without complication ([place]) -     Bayer DCA Hb A1c Waived -     CBC with Differential/Platelet -     CMP14+EGFR  Need for Tdap vaccination -     Tdap vaccine greater than or equal to 7yo IM  Other orders -     lisinopril (ZESTRIL) 10 MG tablet; Take 1 tablet (10 mg total) by mouth daily. -     metFORMIN (GLUCOPHAGE-XR) 500 MG 24 hr tablet; TAKE 4 TABLETS BY MOUTH DAILY WITH BREAKFAST. -     pioglitazone (ACTOS) 45 MG tablet; Take 1 tablet (45 mg total) by mouth daily. -     simvastatin (ZOCOR) 40 MG tablet; Take 1 tablet (40 mg total) by mouth daily. -     Dulaglutide (TRULICITY) 1.5 0000000 SOPN; Inject content of one pen under the skin weekly   I have discontinued Mitchell Garza's glimepiride and Trulicity. I have also changed his lisinopril, pioglitazone, and simvastatin. Additionally, I am having him start on Trulicity. Lastly, I am having him maintain his inFLIXimab, blood glucose meter kit and supplies, traZODone, sildenafil, and metFORMIN.  Meds ordered this encounter  Medications   lisinopril (ZESTRIL) 10 MG tablet    Sig: Take 1 tablet (10 mg total) by mouth daily.    Dispense:  90 tablet    Refill:  3   metFORMIN (GLUCOPHAGE-XR) 500 MG 24 hr tablet    Sig: TAKE 4 TABLETS BY MOUTH DAILY WITH BREAKFAST.    Dispense:  360 tablet    Refill:  2   pioglitazone (ACTOS) 45 MG tablet    Sig: Take 1 tablet (45 mg total) by mouth daily.    Dispense:  90 tablet    Refill:  2   simvastatin (ZOCOR) 40 MG tablet    Sig: Take 1 tablet (40 mg total) by mouth daily.    Dispense:  90 tablet    Refill:  2   Dulaglutide (TRULICITY) 1.5 0000000 SOPN    Sig: Inject content of one pen under the skin weekly    Dispense:  2 mL    Refill:  5     Follow-up: Return in about 3 months (around 04/02/2023).  Claretta Fraise, M.D.

## 2023-01-20 ENCOUNTER — Other Ambulatory Visit: Payer: Self-pay | Admitting: Family Medicine

## 2023-02-04 NOTE — Progress Notes (Signed)
Patient did not complete LDCT as scheduled, reminder letter mailed to patient's listed mailing address asking that the patient call me to schedule follow-up.  

## 2023-02-17 ENCOUNTER — Encounter: Payer: Self-pay | Admitting: Family Medicine

## 2023-02-23 ENCOUNTER — Other Ambulatory Visit: Payer: Self-pay | Admitting: Family Medicine

## 2023-03-19 LAB — HM DIABETES EYE EXAM

## 2023-04-02 ENCOUNTER — Ambulatory Visit: Payer: BC Managed Care – PPO | Admitting: Family Medicine

## 2023-04-02 ENCOUNTER — Encounter: Payer: Self-pay | Admitting: Family Medicine

## 2023-04-02 ENCOUNTER — Ambulatory Visit (INDEPENDENT_AMBULATORY_CARE_PROVIDER_SITE_OTHER): Payer: BC Managed Care – PPO

## 2023-04-02 VITALS — BP 120/75 | HR 77 | Temp 97.9°F | Ht 71.0 in | Wt 254.6 lb

## 2023-04-02 DIAGNOSIS — M79671 Pain in right foot: Secondary | ICD-10-CM | POA: Diagnosis not present

## 2023-04-02 DIAGNOSIS — E119 Type 2 diabetes mellitus without complications: Secondary | ICD-10-CM

## 2023-04-02 DIAGNOSIS — Z13220 Encounter for screening for lipoid disorders: Secondary | ICD-10-CM

## 2023-04-02 DIAGNOSIS — Z7984 Long term (current) use of oral hypoglycemic drugs: Secondary | ICD-10-CM

## 2023-04-02 DIAGNOSIS — M79672 Pain in left foot: Secondary | ICD-10-CM | POA: Diagnosis not present

## 2023-04-02 LAB — LIPID PANEL
Chol/HDL Ratio: 1.9 ratio (ref 0.0–5.0)
Cholesterol, Total: 117 mg/dL (ref 100–199)
HDL: 62 mg/dL (ref >39)
LDL Chol Calc (NIH): 40 mg/dL (ref 0–99)
Triglycerides: 75 mg/dL (ref 0–149)
VLDL Cholesterol Cal: 15 mg/dL (ref 5–40)

## 2023-04-02 LAB — CBC WITH DIFFERENTIAL/PLATELET
Basophils Absolute: 0 10*3/uL (ref 0.0–0.2)
Basos: 1 %
EOS (ABSOLUTE): 0.1 10*3/uL (ref 0.0–0.4)
Eos: 2 %
Hematocrit: 38.5 % (ref 37.5–51.0)
Hemoglobin: 12.9 g/dL — ABNORMAL LOW (ref 13.0–17.7)
Immature Grans (Abs): 0 10*3/uL (ref 0.0–0.1)
Immature Granulocytes: 0 %
Lymphocytes Absolute: 2.3 10*3/uL (ref 0.7–3.1)
Lymphs: 39 %
MCH: 30.6 pg (ref 26.6–33.0)
MCHC: 33.5 g/dL (ref 31.5–35.7)
MCV: 91 fL (ref 79–97)
Monocytes Absolute: 0.4 10*3/uL (ref 0.1–0.9)
Monocytes: 7 %
Neutrophils Absolute: 3 10*3/uL (ref 1.4–7.0)
Neutrophils: 51 %
Platelets: 251 10*3/uL (ref 150–450)
RBC: 4.21 x10E6/uL (ref 4.14–5.80)
RDW: 12.7 % (ref 11.6–15.4)
WBC: 5.9 10*3/uL (ref 3.4–10.8)

## 2023-04-02 LAB — CMP14+EGFR
ALT: 22 IU/L (ref 0–44)
AST: 20 IU/L (ref 0–40)
Albumin/Globulin Ratio: 1.7
Albumin: 4.3 g/dL (ref 3.9–4.9)
Alkaline Phosphatase: 53 IU/L (ref 44–121)
BUN/Creatinine Ratio: 16 (ref 10–24)
BUN: 15 mg/dL (ref 8–27)
Bilirubin Total: 0.4 mg/dL (ref 0.0–1.2)
CO2: 27 mmol/L (ref 20–29)
Calcium: 9.2 mg/dL (ref 8.6–10.2)
Chloride: 103 mmol/L (ref 96–106)
Creatinine, Ser: 0.95 mg/dL (ref 0.76–1.27)
Globulin, Total: 2.6 g/dL (ref 1.5–4.5)
Glucose: 119 mg/dL — ABNORMAL HIGH (ref 70–99)
Potassium: 4.9 mmol/L (ref 3.5–5.2)
Sodium: 142 mmol/L (ref 134–144)
Total Protein: 6.9 g/dL (ref 6.0–8.5)
eGFR: 91 mL/min/{1.73_m2} (ref >59)

## 2023-04-02 LAB — BAYER DCA HB A1C WAIVED: HB A1C (BAYER DCA - WAIVED): 6.7 % — ABNORMAL HIGH (ref 4.8–5.6)

## 2023-04-02 MED ORDER — VALSARTAN 160 MG PO TABS
160.0000 mg | ORAL_TABLET | Freq: Every day | ORAL | 3 refills | Status: DC
Start: 2023-04-02 — End: 2023-12-29

## 2023-04-02 NOTE — Progress Notes (Signed)
Subjective:  [name], male    DOB: [birth year]  Age: 62 y.o. MRN: [id number]  CC: Medical Management of Chronic Issues   HPI Mitchell Garza presents forFollow-up of diabetes. Patient checks blood sugar at home.   120 fasting and 150 postprandial A little higher since DC of glimepiride, but no lows.  Patient denies symptoms such as polyuria, polydipsia, excessive hunger, nausea No significant hypoglycemic spells noted. Medications reviewed. Pt reports taking them regularly without complication/adverse reaction being reported today.    in for follow-up of elevated cholesterol. Doing well without complaints on current medication. Denies side effects of statin including myalgia and arthralgia and nausea. Currently no chest pain, shortness of breath or other cardiovascular related symptoms noted.  Pain in the dorsal mid foot bilaterally.   History Mitchell Garza has a past medical history of Crohn's disease ([facility]), Diabetes mellitus without complication (H[facility]), and Sleep apnea.   Mitchell Garza has a past surgical history that includes arthroscopic knee surgery (Bilateral).   His family history includes Alcohol abuse in his mother; Cancer in his father; Diabetes in his brother, mother, and sister.Mitchell Garza reports that Mitchell Garza has been smoking cigarettes. Mitchell Garza has a 30.00 pack-year smoking history. Mitchell Garza has never used smokeless tobacco. Mitchell Garza reports that Mitchell Garza does not currently use alcohol. Mitchell Garza reports that Mitchell Garza does not use drugs.  Current Outpatient Medications on File Prior to Visit  Medication Sig Dispense Refill   blood glucose meter kit and supplies KIT Dispense based on patient and insurance preference. Use up to four times daily as directed. (FOR ICD-10 : E11.9 1 each 9   Dulaglutide (TRULICITY) 1.5 MG/0.5ML SOPN Inject content of one pen under the skin weekly 2 mL 5   inFLIXimab (REMICADE) 100 MG injection Inject into the vein every 6 (six) weeks.     metFORMIN (GLUCOPHAGE-XR) 500 MG 24 hr tablet TAKE 4  TABLETS BY MOUTH DAILY WITH BREAKFAST. 360 tablet 2   pioglitazone (ACTOS) 45 MG tablet Take 1 tablet (45 mg total) by mouth daily. 90 tablet 2   sildenafil (REVATIO) 20 MG tablet TAKE 1 TABLET BY MOUTH AS NEEDED 10 tablet 5   simvastatin (ZOCOR) 40 MG tablet Take 1 tablet (40 mg total) by mouth daily. 90 tablet 2   traZODone (DESYREL) 150 MG tablet USE FROM 1/3 TO 1 TABLET NIGHTLY AS NEEDED FOR SLEEP. 90 tablet 3   No current facility-administered medications on file prior to visit.    ROS Review of Systems  Constitutional:  Negative for fever.  Respiratory:  Negative for shortness of breath.   Cardiovascular:  Negative for chest pain.  Musculoskeletal:  Negative for arthralgias.  Skin:  Negative for rash.    Objective:  BP 120/75   Pulse 77   Temp 97.9 F (36.6 C)   Ht 5\' 11"  (1.803 m)   Wt 254 lb 9.6 oz (115.5 kg)   SpO2 94%   BMI 35.51 kg/m   BP Readings from Last 3 Encounters:  04/02/23 120/75  12/31/22 116/61  12/08/22 (!) 156/87    Wt Readings from Last 3 Encounters:  04/02/23 254 lb 9.6 oz (115.5 kg)  12/31/22 259 lb 12.8 oz (117.8 kg)  12/08/22 258 lb (117 kg)     Physical Exam Vitals reviewed.  Constitutional:      Appearance: Mitchell Garza is well-developed.  HENT:     Head: Normocephalic and atraumatic.     Right Ear: External ear normal.     Left Ear: External ear normal.  Mouth/Throat:     Pharynx: No oropharyngeal exudate or posterior oropharyngeal erythema.  Eyes:     Pupils: Pupils are equal, round, and reactive to light.  Cardiovascular:     Rate and Rhythm: Normal rate and regular rhythm.     Heart sounds: No murmur heard. Pulmonary:     Effort: No respiratory distress.     Breath sounds: Normal breath sounds.  Musculoskeletal:     Cervical back: Normal range of motion and neck supple.  Neurological:     Mental Status: Mitchell Garza is alert and oriented to person, place, and time.       Assessment & Plan:   Mitchell Garza was seen today for medical  management of chronic issues.  Diagnoses and all orders for this visit:  Diabetes mellitus without complication ([facility]) -     Bayer DCA Hb A1c Waived -     CBC with Differential/Platelet -     CMP14+EGFR  Lipid screening -     Lipid panel  Foot pain, bilateral -     DG Foot Complete Left; Future -     DG Foot Complete Right; Future  Other orders -     valsartan (DIOVAN) 160 MG tablet; Take 1 tablet (160 mg total) by mouth daily. For blood pressure and kidney protection      I have discontinued Mitchell Garza's lisinopril. [name] start on valsartan. Additionally, I am having him maintain his inFLIXimab, blood glucose meter kit and supplies, traZODone, sildenafil, metFORMIN, pioglitazone, simvastatin, and Trulicity.  Meds ordered this encounter  Medications   valsartan (DIOVAN) 160 MG tablet    Sig: Take 1 tablet (160 mg total) by mouth daily. For blood pressure and kidney protection    Dispense:  90 tablet    Refill:  3     Follow-up: Return in about 3 months (around 07/03/2023).  Mechele Claude, M.D.

## 2023-04-06 ENCOUNTER — Ambulatory Visit (HOSPITAL_COMMUNITY)
Admission: RE | Admit: 2023-04-06 | Discharge: 2023-04-06 | Disposition: A | Discharge status: Home / Self Care | Payer: BC Managed Care – PPO | Source: Ambulatory Visit | Attending: Physician Assistant | Admitting: Physician Assistant

## 2023-04-06 DIAGNOSIS — Z87891 Personal history of nicotine dependence: Secondary | ICD-10-CM | POA: Diagnosis present

## 2023-04-06 DIAGNOSIS — Z122 Encounter for screening for malignant neoplasm of respiratory organs: Secondary | ICD-10-CM | POA: Diagnosis present

## 2023-04-06 NOTE — Progress Notes (Signed)
Hello Yoshio,  Your lab result is normal and/or stable.Some minor variations that are not significant are commonly marked abnormal, but do not represent any medical problem for you.  Best regards, Joyice Magda, M.D.

## 2023-04-13 NOTE — Progress Notes (Signed)
Patient notified of LDCT Lung Cancer Screening Results via mail with the recommendation to follow-up in 12 months. Patient's referring provider has been sent a copy of results. Results are as follows:   IMPRESSION: Lung-RADS 2, benign appearance or behavior. Continue annual screening with low-dose chest CT without contrast in 12 months.   Aortic Atherosclerosis (ICD10-I70.0) and Emphysema (ICD10-J43.9). 

## 2023-07-06 ENCOUNTER — Ambulatory Visit: Payer: BC Managed Care – PPO | Admitting: Family Medicine

## 2023-07-06 DIAGNOSIS — K5090 Crohn's disease, unspecified, without complications: Secondary | ICD-10-CM | POA: Diagnosis not present

## 2023-07-09 ENCOUNTER — Ambulatory Visit: Payer: BC Managed Care – PPO | Admitting: Family Medicine

## 2023-07-11 ENCOUNTER — Other Ambulatory Visit: Payer: Self-pay | Admitting: Family Medicine

## 2023-07-22 ENCOUNTER — Encounter: Payer: Self-pay | Admitting: Family Medicine

## 2023-07-22 MED ORDER — TRULICITY 1.5 MG/0.5ML ~~LOC~~ SOAJ: SUBCUTANEOUS | 3 refills | Status: DC

## 2023-07-29 ENCOUNTER — Telehealth: Payer: Self-pay

## 2023-07-29 NOTE — Telephone Encounter (Signed)
Marcy Siren (KeyDenna Haggard) Rx #: [id number] Need Help? Call us at [phone] Status sent iconSent to Plan today Drug Trulicity 1.5MG /0.5ML auto-injectors ePA cloud logo Form [employer] [place] [employer] Electronic Request Form Original Claim Info 75

## 2023-07-30 NOTE — Telephone Encounter (Signed)
Mitchell Garza (Key: [id number]) PA Case ID #: [id number] Rx #: [id number] Need Help? Call us at [phone] Status sent iconSent to Plan today Next Steps The plan will fax you a determination, typically within 1 to 5 business days. Drug Trulicity 1.5MG /0.5ML auto-injectors ePA cloud logo Form Psychologist, forensic Info 75

## 2023-07-30 NOTE — Telephone Encounter (Signed)
Clinical questions answered. PA submitted

## 2023-07-31 ENCOUNTER — Other Ambulatory Visit (HOSPITAL_COMMUNITY): Payer: Self-pay

## 2023-07-31 NOTE — Telephone Encounter (Signed)
Pharmacy Patient Advocate Encounter  Received notification from [facility] that Prior Authorization for Trulicity 1.5MG /0.5ML auto-injectors has been APPROVED from 07/30/23 to 07/29/24. Ran test claim, Copay is $25. This test claim was processed through [facility]- copay amounts may vary at other pharmacies due to pharmacy/plan contracts, or as the patient moves through the different stages of their insurance plan.   PA #/Case ID/Reference #:  [id number]

## 2023-08-11 ENCOUNTER — Ambulatory Visit: Payer: BC Managed Care – PPO | Admitting: Family Medicine

## 2023-08-17 DIAGNOSIS — K5090 Crohn's disease, unspecified, without complications: Secondary | ICD-10-CM | POA: Diagnosis not present

## 2023-08-25 ENCOUNTER — Encounter: Payer: Self-pay | Admitting: Family Medicine

## 2023-08-25 ENCOUNTER — Ambulatory Visit (INDEPENDENT_AMBULATORY_CARE_PROVIDER_SITE_OTHER): Payer: BC Managed Care – PPO | Admitting: Family Medicine

## 2023-08-25 VITALS — BP 129/67 | HR 85 | Temp 98.2°F | Ht 71.0 in | Wt 261.2 lb

## 2023-08-25 DIAGNOSIS — E119 Type 2 diabetes mellitus without complications: Secondary | ICD-10-CM

## 2023-08-25 DIAGNOSIS — Z23 Encounter for immunization: Secondary | ICD-10-CM

## 2023-08-25 DIAGNOSIS — Z7984 Long term (current) use of oral hypoglycemic drugs: Secondary | ICD-10-CM | POA: Diagnosis not present

## 2023-08-25 DIAGNOSIS — Z13220 Encounter for screening for lipoid disorders: Secondary | ICD-10-CM

## 2023-08-25 LAB — BAYER DCA HB A1C WAIVED: HB A1C (BAYER DCA - WAIVED): 7.1 % — ABNORMAL HIGH (ref 4.8–5.6)

## 2023-08-25 MED ORDER — TRULICITY 3 MG/0.5ML ~~LOC~~ SOAJ: 3.0000 mg | SUBCUTANEOUS | 1 refills | Status: DC

## 2023-08-25 MED ORDER — TRAZODONE HCL 150 MG PO TABS: ORAL_TABLET | ORAL | 3 refills | Status: AC

## 2023-08-25 NOTE — Addendum Note (Signed)
Addended by: Diamantina Monks on: 08/25/2023 04:37 PM   Modules accepted: Orders

## 2023-08-25 NOTE — Progress Notes (Signed)
Subjective:  [name],  male    DOB: 06-Apr-1961  Age: 62 y.o.    CC: Medical Management of Chronic Issues   HPI Tristan Proto presents for  follow-up of hypertension. Patient has no history of headache chest pain or shoul diet. rtness of breath or recent cough. Patient also denies symptoms of TIA such as numbness weakness lateralizing. Patient denies side effects from medication. States taking it regularly.  Patient also  in for follow-up of elevated cholesterol. Doing well without complaints on current medication. Denies side effects  including myalgia and arthralgia and nausea. Also in today for liver function testing. Currently no chest pain, shortness of breath or other cardiovascular related symptoms noted.  Follow-up of diabetes. Patient does check blood sugar at home. Readings run between 115 fasting  and 125-130 prandial. Patient denies symptoms such as excessive hunger or urinary frequency, excessive hunger, nausea No significant hypoglycemic spells noted. Can't get weight off in spite of regular exercise and careful diet.  Medications reviewed. Pt reports taking them regularly. Pt. denies complication/adverse reaction today.   Crohns well controlled. 2 Bms a day. Continues remicade.    History Amiri has a past medical history of Crohn's disease ([facility]), Diabetes mellitus without complication ([facility]), and Sleep apnea.   He has a past surgical history that includes arthroscopic knee surgery (Bilateral).   His family history includes Alcohol abuse in his mother; Cancer in his father; Diabetes in his brother, mother, and sister.He reports that he has been smoking cigarettes. He has a 30 pack-year smoking history. He has never used smokeless tobacco. He reports that he does not currently use alcohol. He reports that he does not use drugs.  Current Outpatient Medications on File Prior to Visit  Medication Sig Dispense Refill   blood glucose meter kit and supplies  KIT Dispense based on patient and insurance preference. Use up to four times daily as directed. (FOR ICD-10 : E11.9 1 each 9   inFLIXimab (REMICADE) 100 MG injection Inject into the vein every 6 (six) weeks.     metFORMIN (GLUCOPHAGE-XR) 500 MG 24 hr tablet TAKE 4 TABLETS BY MOUTH DAILY WITH BREAKFAST. 360 tablet 2   pioglitazone (ACTOS) 45 MG tablet Take 1 tablet (45 mg total) by mouth daily. 90 tablet 2   sildenafil (REVATIO) 20 MG tablet TAKE 1 TABLET BY MOUTH AS NEEDED 10 tablet 5   simvastatin (ZOCOR) 40 MG tablet Take 1 tablet (40 mg total) by mouth daily. 90 tablet 2   valsartan (DIOVAN) 160 MG tablet Take 1 tablet (160 mg total) by mouth daily. For blood pressure and kidney protection 90 tablet 3   No current facility-administered medications on file prior to visit.    ROS Review of Systems  Constitutional:  Negative for appetite change and fever.  Respiratory:  Negative for shortness of breath.   Cardiovascular:  Negative for chest pain.  Musculoskeletal:  Negative for arthralgias.  Skin:  Negative for rash.    Objective:  BP 129/67   Pulse 85   Temp 98.2 F (36.8 C)   Ht 5\' 11"  (1.803 m)   Wt 261 lb 3.2 oz (118.5 kg)   SpO2 92%   BMI 36.43 kg/m   BP Readings from Last 3 Encounters:  08/25/23 129/67  04/02/23 120/75  12/31/22 116/61    Wt Readings from Last 3 Encounters:  08/25/23 261 lb 3.2 oz (118.5 kg)  04/02/23 254 lb 9.6 oz (115.5 kg)  12/31/22 259 lb 12.8  oz (117.8 kg)     Physical Exam Vitals reviewed.  Constitutional:      Appearance: He is well-developed.  HENT:     Head: Normocephalic and atraumatic.     Right Ear: External ear normal.     Left Ear: External ear normal.     Mouth/Throat:     Pharynx: No oropharyngeal exudate or posterior oropharyngeal erythema.  Eyes:     Pupils: Pupils are equal, round, and reactive to light.  Cardiovascular:     Rate and Rhythm: Normal rate and regular rhythm.     Heart sounds: No murmur  heard. Pulmonary:     Effort: No respiratory distress.     Breath sounds: Normal breath sounds.  Musculoskeletal:     Cervical back: Normal range of motion and neck supple.  Neurological:     Mental Status: He is alert and oriented to person, place, and time.     Diabetic Foot Exam - Simple   No data filed     Lab Results  Component Value Date   HGBA1C 7.1 (H) 08/25/2023   HGBA1C 6.7 (H) 04/02/2023   HGBA1C 5.7 (H) 12/31/2022    Assessment & Plan:   Rafal was seen today for medical management of chronic issues.  Diagnoses and all orders for this visit:  Lipid screening -     Lipid panel  Diabetes mellitus without complication (HCC) -     Bayer DCA Hb A1c Waived -     CBC with Differential/Platelet -     CMP14+EGFR  Other orders -     traZODone (DESYREL) 150 MG tablet; USE FROM 1/3 TO 1 TABLET NIGHTLY AS NEEDED FOR SLEEP. -     Dulaglutide (TRULICITY) 3 MG/0.5ML SOAJ; Inject 3 mg as directed once a week.   I have discontinued [employer]. [name] start on Trulicity. Additionally, I am having him maintain his inFLIXimab, blood glucose meter kit and supplies, sildenafil, metFORMIN, pioglitazone, simvastatin, valsartan, and traZODone.  Meds ordered this encounter  Medications   traZODone (DESYREL) 150 MG tablet    Sig: USE FROM 1/3 TO 1 TABLET NIGHTLY AS NEEDED FOR SLEEP.    Dispense:  90 tablet    Refill:  3   Dulaglutide (TRULICITY) 3 MG/0.5ML SOAJ    Sig: Inject 3 mg as directed once a week.    Dispense:  6 mL    Refill:  1     Follow-up: Return in about 3 months (around 11/25/2023).  Mechele Claude, M.D.

## 2023-08-26 LAB — CBC WITH DIFFERENTIAL/PLATELET
Basophils Absolute: 0.1 10*3/uL (ref 0.0–0.2)
Basos: 1 %
EOS (ABSOLUTE): 0.2 10*3/uL (ref 0.0–0.4)
Eos: 2 %
Hematocrit: 39.2 % (ref 37.5–51.0)
Hemoglobin: 12.8 g/dL — ABNORMAL LOW (ref 13.0–17.7)
Immature Grans (Abs): 0 10*3/uL (ref 0.0–0.1)
Immature Granulocytes: 0 %
Lymphocytes Absolute: 2.3 10*3/uL (ref 0.7–3.1)
Lymphs: 30 %
MCH: 31.1 pg (ref 26.6–33.0)
MCHC: 32.7 g/dL (ref 31.5–35.7)
MCV: 95 fL (ref 79–97)
Monocytes Absolute: 0.6 10*3/uL (ref 0.1–0.9)
Monocytes: 7 %
Neutrophils Absolute: 4.7 10*3/uL (ref 1.4–7.0)
Neutrophils: 60 %
Platelets: 244 10*3/uL (ref 150–450)
RBC: 4.12 x10E6/uL — ABNORMAL LOW (ref 4.14–5.80)
RDW: 13 % (ref 11.6–15.4)
WBC: 7.8 10*3/uL (ref 3.4–10.8)

## 2023-08-26 LAB — CMP14+EGFR
ALT: 21 [IU]/L (ref 0–44)
AST: 19 [IU]/L (ref 0–40)
Albumin: 4.1 g/dL (ref 3.9–4.9)
Alkaline Phosphatase: 55 [IU]/L (ref 44–121)
BUN/Creatinine Ratio: 18 (ref 10–24)
BUN: 17 mg/dL (ref 8–27)
Bilirubin Total: 0.3 mg/dL (ref 0.0–1.2)
CO2: 25 mmol/L (ref 20–29)
Calcium: 9.1 mg/dL (ref 8.6–10.2)
Chloride: 105 mmol/L (ref 96–106)
Creatinine, Ser: 0.96 mg/dL (ref 0.76–1.27)
Globulin, Total: 2.7 g/dL (ref 1.5–4.5)
Glucose: 105 mg/dL — ABNORMAL HIGH (ref 70–99)
Potassium: 4.6 mmol/L (ref 3.5–5.2)
Sodium: 144 mmol/L (ref 134–144)
Total Protein: 6.8 g/dL (ref 6.0–8.5)
eGFR: 90 mL/min/{1.73_m2} (ref >59)

## 2023-08-26 LAB — LIPID PANEL
Chol/HDL Ratio: 2.1 ratio (ref 0.0–5.0)
Cholesterol, Total: 116 mg/dL (ref 100–199)
HDL: 55 mg/dL (ref >39)
LDL Chol Calc (NIH): 43 mg/dL (ref 0–99)
Triglycerides: 99 mg/dL (ref 0–149)
VLDL Cholesterol Cal: 18 mg/dL (ref 5–40)

## 2023-08-26 NOTE — Progress Notes (Signed)
Hello Mitchell Garza,  Your lab result is normal and/or stable.Some minor variations that are not significant are commonly marked abnormal, but do not represent any medical problem for you.  Best regards, Jesten Cappuccio, M.D.

## 2023-09-26 ENCOUNTER — Other Ambulatory Visit: Payer: Self-pay | Admitting: Family Medicine

## 2023-09-28 DIAGNOSIS — K5090 Crohn's disease, unspecified, without complications: Secondary | ICD-10-CM | POA: Diagnosis not present

## 2023-09-28 DIAGNOSIS — Z79899 Other long term (current) drug therapy: Secondary | ICD-10-CM | POA: Diagnosis not present

## 2023-11-09 DIAGNOSIS — K5090 Crohn's disease, unspecified, without complications: Secondary | ICD-10-CM | POA: Diagnosis not present

## 2023-11-19 ENCOUNTER — Other Ambulatory Visit: Payer: Self-pay | Admitting: Family Medicine

## 2023-11-26 ENCOUNTER — Ambulatory Visit: Payer: BC Managed Care – PPO | Admitting: Family Medicine

## 2023-12-09 ENCOUNTER — Ambulatory Visit: Payer: BC Managed Care – PPO | Admitting: Adult Health

## 2023-12-20 ENCOUNTER — Other Ambulatory Visit: Payer: Self-pay | Admitting: Family Medicine

## 2023-12-21 DIAGNOSIS — K5090 Crohn's disease, unspecified, without complications: Secondary | ICD-10-CM | POA: Diagnosis not present

## 2023-12-23 ENCOUNTER — Ambulatory Visit (INDEPENDENT_AMBULATORY_CARE_PROVIDER_SITE_OTHER): Payer: BC Managed Care – PPO | Admitting: Family Medicine

## 2023-12-23 ENCOUNTER — Ambulatory Visit (INDEPENDENT_AMBULATORY_CARE_PROVIDER_SITE_OTHER)

## 2023-12-23 ENCOUNTER — Encounter: Payer: Self-pay | Admitting: Family Medicine

## 2023-12-23 VITALS — BP 139/80 | HR 90 | Temp 97.5°F | Ht 71.0 in | Wt 266.0 lb

## 2023-12-23 DIAGNOSIS — E756 Lipid storage disorder, unspecified: Secondary | ICD-10-CM

## 2023-12-23 DIAGNOSIS — Z23 Encounter for immunization: Secondary | ICD-10-CM

## 2023-12-23 DIAGNOSIS — G8929 Other chronic pain: Secondary | ICD-10-CM

## 2023-12-23 DIAGNOSIS — Z13220 Encounter for screening for lipoid disorders: Secondary | ICD-10-CM

## 2023-12-23 DIAGNOSIS — E1169 Type 2 diabetes mellitus with other specified complication: Secondary | ICD-10-CM

## 2023-12-23 DIAGNOSIS — M25511 Pain in right shoulder: Secondary | ICD-10-CM

## 2023-12-23 DIAGNOSIS — K5010 Crohn's disease of large intestine without complications: Secondary | ICD-10-CM | POA: Diagnosis not present

## 2023-12-23 DIAGNOSIS — M19011 Primary osteoarthritis, right shoulder: Secondary | ICD-10-CM | POA: Diagnosis not present

## 2023-12-23 DIAGNOSIS — E119 Type 2 diabetes mellitus without complications: Secondary | ICD-10-CM

## 2023-12-23 DIAGNOSIS — Z7984 Long term (current) use of oral hypoglycemic drugs: Secondary | ICD-10-CM

## 2023-12-23 LAB — LIPID PANEL

## 2023-12-23 LAB — BAYER DCA HB A1C WAIVED: HB A1C (BAYER DCA - WAIVED): 6.3 % — ABNORMAL HIGH (ref 4.8–5.6)

## 2023-12-23 MED ORDER — METFORMIN HCL ER 500 MG PO TB24: ORAL_TABLET | ORAL | 0 refills | Status: DC

## 2023-12-23 MED ORDER — SIMVASTATIN 40 MG PO TABS: 40.0000 mg | ORAL_TABLET | Freq: Every day | ORAL | 0 refills | Status: DC

## 2023-12-23 MED ORDER — TRULICITY 3 MG/0.5ML ~~LOC~~ SOAJ: 3.0000 mg | SUBCUTANEOUS | 1 refills | Status: DC

## 2023-12-23 NOTE — Progress Notes (Signed)
 Subjective:  [name],  male    DOB: 1961/10/05  Age: 63 y.o.    CC: Medical Management of Chronic Issues, Shoulder Pain (Right shoulder pain for one year.  Hurts when raising arm and when reaching. Pt reports the pain is severe enough it arm goes dead and he can not move it. ROM lacking. ), and Hypertension (Bp is elevated when he goes to get a infusion. Not taking at home. )   HPI Mitchell Garza presents for  follow-up of hypertension. Patient has no history of headache chest pain or shortness of breath or recent cough. Patient also denies symptoms of TIA such as numbness weakness lateralizing. Patient denies side effects from medication. States taking it regularly.  Patient also  in for follow-up of elevated cholesterol. Doing well without complaints on current medication. Denies side effects  including myalgia and arthralgia and nausea. Also in today for liver function testing. Currently no chest pain, shortness of breath or other cardiovascular related symptoms noted.  Follow-up of diabetes. Patient does check blood sugar at home. Readings run between 105 fasting and 130 prandial Patient denies symptoms such as excessive hunger or urinary frequency, excessive hunger, nausea No significant hypoglycemic spells noted. Medications reviewed. Pt reports taking them regularly. Pt. denies complication/adverse reaction today.   Deep pain at ball of right shoulder. Getting worsee over last few months.Pain with abduction and ext. Rotation. Had shoulder separtation 30 years ago.    History [name] has a past medical history of Crohn's disease (HCC), Diabetes mellitus without complication (HCC), and Sleep apnea.   He has a past surgical history that includes arthroscopic knee surgery (Bilateral).   His family history includes Alcohol abuse in his mother; Cancer in his father; Diabetes in his brother, mother, and sister.He reports that he has been smoking cigarettes. He has a 30  pack-year smoking history. He has never used smokeless tobacco. He reports that he does not currently use alcohol. He reports that he does not use drugs.  Current Outpatient Medications on File Prior to Visit  Medication Sig Dispense Refill   blood glucose meter kit and supplies KIT Dispense based on patient and insurance preference. Use up to four times daily as directed. (FOR ICD-10 : E11.9 1 each 9   inFLIXimab (REMICADE) 100 MG injection Inject into the vein every 6 (six) weeks.     pioglitazone (ACTOS) 45 MG tablet TAKE 1 TABLET BY MOUTH EVERY DAY 90 tablet 0   sildenafil (REVATIO) 20 MG tablet TAKE 1 TABLET BY MOUTH AS NEEDED 10 tablet 5   traZODone (DESYREL) 150 MG tablet USE FROM 1/3 TO 1 TABLET NIGHTLY AS NEEDED FOR SLEEP. 90 tablet 3   valsartan (DIOVAN) 160 MG tablet Take 1 tablet (160 mg total) by mouth daily. For blood pressure and kidney protection 90 tablet 3   No current facility-administered medications on file prior to visit.    ROS Review of Systems  Constitutional:  Negative for fever.  Respiratory:  Negative for shortness of breath.   Cardiovascular:  Negative for chest pain.  Musculoskeletal:  Positive for arthralgias.  Skin:  Negative for rash.    Objective:  BP 139/80   Pulse 90   Temp (!) 97.5 F (36.4 C)   Ht 5\' 11"  (1.803 m)   Wt 266 lb (120.7 kg)   SpO2 91%   BMI 37.10 kg/m   BP Readings from Last 3 Encounters:  12/23/23 139/80  08/25/23 129/67  04/02/23 120/75  Wt Readings from Last 3 Encounters:  12/23/23 266 lb (120.7 kg)  08/25/23 261 lb 3.2 oz (118.5 kg)  04/02/23 254 lb 9.6 oz (115.5 kg)     Physical Exam Vitals reviewed.  Constitutional:      Appearance: He is well-developed.  HENT:     Head: Normocephalic and atraumatic.     Right Ear: External ear normal.     Left Ear: External ear normal.     Mouth/Throat:     Pharynx: No oropharyngeal exudate or posterior oropharyngeal erythema.  Eyes:     Pupils: Pupils are equal,  round, and reactive to light.  Cardiovascular:     Rate and Rhythm: Normal rate and regular rhythm.     Heart sounds: No murmur heard. Pulmonary:     Effort: No respiratory distress.     Breath sounds: Normal breath sounds.  Musculoskeletal:        General: Tenderness (right shoulder for abducted rotation) present.     Cervical back: Normal range of motion and neck supple.  Neurological:     Mental Status: He is alert and oriented to person, place, and time.     Diabetic Foot Exam - Simple   Simple Foot Form Diabetic Foot exam was performed with the following findings: Yes 12/23/2023  1:57 PM  Visual Inspection No deformities, no ulcerations, no other skin breakdown bilaterally: Yes Sensation Testing Intact to touch and monofilament testing bilaterally: Yes Pulse Check Posterior Tibialis and Dorsalis pulse intact bilaterally: Yes Comments     Lab Results  Component Value Date   HGBA1C 7.1 (H) 08/25/2023   HGBA1C 6.7 (H) 04/02/2023   HGBA1C 5.7 (H) 12/31/2022    Assessment & Plan:   Trenell was seen today for medical management of chronic issues, shoulder pain and hypertension.  Diagnoses and all orders for this visit:  Lipid screening -     Lipid panel  Diabetes mellitus without complication (HCC) -     Bayer DCA Hb A1c Waived  Diabetic lipidosis (HCC) -     Bayer DCA Hb A1c Waived  Crohn's disease of large intestine without complication (HCC) -     CBC with Differential/Platelet -     CMP14+EGFR  Immunization due -     Pneumococcal conjugate vaccine 20-valent (Prevnar 20)  Chronic right shoulder pain -     Ambulatory referral to Orthopedics -     DG Shoulder Right; Future  Other orders -     Dulaglutide (TRULICITY) 3 MG/0.5ML SOAJ; Inject 3 mg as directed once a week. -     metFORMIN (GLUCOPHAGE-XR) 500 MG 24 hr tablet; TAKE 4 TABLETS BY MOUTH DAILY WITH BREAKFAST. -     simvastatin (ZOCOR) 40 MG tablet; Take 1 tablet (40 mg total) by mouth daily.   I  have changed [name]'s simvastatin. [name] maintain his inFLIXimab, blood glucose meter kit and supplies, sildenafil, valsartan, traZODone, pioglitazone, Trulicity, and metFORMIN.  Meds ordered this encounter  Medications   Dulaglutide (TRULICITY) 3 MG/0.5ML SOAJ    Sig: Inject 3 mg as directed once a week.    Dispense:  6 mL    Refill:  1   metFORMIN (GLUCOPHAGE-XR) 500 MG 24 hr tablet    Sig: TAKE 4 TABLETS BY MOUTH DAILY WITH BREAKFAST.    Dispense:  360 tablet    Refill:  0   simvastatin (ZOCOR) 40 MG tablet    Sig: Take 1 tablet (40 mg total) by mouth daily.  Dispense:  90 tablet    Refill:  0     Follow-up: Return in about 3 months (around 03/24/2024).  Mechele Claude, M.D.

## 2023-12-24 ENCOUNTER — Encounter: Payer: Self-pay | Admitting: Family Medicine

## 2023-12-24 LAB — CMP14+EGFR
ALT: 29 IU/L (ref 0–44)
AST: 19 IU/L (ref 0–40)
Albumin: 4.3 g/dL (ref 3.9–4.9)
Alkaline Phosphatase: 51 IU/L (ref 44–121)
BUN/Creatinine Ratio: 11 (ref 10–24)
BUN: 10 mg/dL (ref 8–27)
Bilirubin Total: 0.5 mg/dL (ref 0.0–1.2)
CO2: 27 mmol/L (ref 20–29)
Calcium: 9.4 mg/dL (ref 8.6–10.2)
Chloride: 105 mmol/L (ref 96–106)
Creatinine, Ser: 0.88 mg/dL (ref 0.76–1.27)
Globulin, Total: 2.5 g/dL (ref 1.5–4.5)
Glucose: 126 mg/dL — ABNORMAL HIGH (ref 70–99)
Potassium: 4.2 mmol/L (ref 3.5–5.2)
Sodium: 143 mmol/L (ref 134–144)
Total Protein: 6.8 g/dL (ref 6.0–8.5)
eGFR: 98 mL/min/{1.73_m2} (ref >59)

## 2023-12-24 LAB — CBC WITH DIFFERENTIAL/PLATELET
Basophils Absolute: 0 10*3/uL (ref 0.0–0.2)
Basos: 0 %
EOS (ABSOLUTE): 0.1 10*3/uL (ref 0.0–0.4)
Eos: 1 %
Hematocrit: 38.1 % (ref 37.5–51.0)
Hemoglobin: 13 g/dL (ref 13.0–17.7)
Immature Grans (Abs): 0 10*3/uL (ref 0.0–0.1)
Immature Granulocytes: 0 %
Lymphocytes Absolute: 2.1 10*3/uL (ref 0.7–3.1)
Lymphs: 30 %
MCH: 31.9 pg (ref 26.6–33.0)
MCHC: 34.1 g/dL (ref 31.5–35.7)
MCV: 94 fL (ref 79–97)
Monocytes Absolute: 0.5 10*3/uL (ref 0.1–0.9)
Monocytes: 7 %
Neutrophils Absolute: 4.3 10*3/uL (ref 1.4–7.0)
Neutrophils: 62 %
Platelets: 261 10*3/uL (ref 150–450)
RBC: 4.07 x10E6/uL — ABNORMAL LOW (ref 4.14–5.80)
RDW: 13.2 % (ref 11.6–15.4)
WBC: 7 10*3/uL (ref 3.4–10.8)

## 2023-12-24 LAB — LIPID PANEL
Cholesterol, Total: 103 mg/dL (ref 100–199)
HDL: 58 mg/dL (ref >39)
LDL CALC COMMENT:: 1.8 ratio (ref 0.0–5.0)
LDL Chol Calc (NIH): 27 mg/dL (ref 0–99)
Triglycerides: 96 mg/dL (ref 0–149)
VLDL Cholesterol Cal: 18 mg/dL (ref 5–40)

## 2023-12-24 NOTE — Progress Notes (Signed)
Hello Oluwaferanmi,  Your lab result is normal and/or stable.Some minor variations that are not significant are commonly marked abnormal, but do not represent any medical problem for you.  Best regards, Jesten Cappuccio, M.D.

## 2023-12-29 ENCOUNTER — Other Ambulatory Visit: Payer: Self-pay | Admitting: Family Medicine

## 2023-12-29 MED ORDER — VALSARTAN 320 MG PO TABS: 320.0000 mg | ORAL_TABLET | Freq: Every day | ORAL | 1 refills | Status: DC

## 2024-01-08 ENCOUNTER — Other Ambulatory Visit: Payer: Self-pay | Admitting: Family Medicine

## 2024-01-13 ENCOUNTER — Other Ambulatory Visit (INDEPENDENT_AMBULATORY_CARE_PROVIDER_SITE_OTHER): Payer: Self-pay

## 2024-01-13 ENCOUNTER — Ambulatory Visit: Payer: Self-pay | Admitting: Orthopedic Surgery

## 2024-01-13 ENCOUNTER — Encounter: Payer: Self-pay | Admitting: Orthopedic Surgery

## 2024-01-13 DIAGNOSIS — M25511 Pain in right shoulder: Secondary | ICD-10-CM | POA: Diagnosis not present

## 2024-01-13 DIAGNOSIS — G8929 Other chronic pain: Secondary | ICD-10-CM

## 2024-01-13 NOTE — Progress Notes (Signed)
 Office Visit Note   Patient: Mitchell Garza           Date of Birth: 04-05-1961           MRN: [id number] Visit Date: 01/13/2024 Requested by: Mechele Claude, MD [address] [place],  [place] [address] PCP: Mechele Claude, MD  Subjective: Chief Complaint  Patient presents with   Right Shoulder - Pain    No Injuries  Pain x 1 year with occ. Mobility issues Retired    HPI: Mitchell Garza is a 63 y.o. male who presents to the office reporting right shoulder pain.  Describes chronic right shoulder pain.  Been going on for a year.  No history of injury .  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  Does affect his sleep.  Reports some weakness.  Feels like his dominant arm is less strong than his nondominant left arm.  Denies any new mechanical symptoms.  Takes Aleve occasionally.  Denies any neck pain.  No numbness and tingling.  He is retired.  Likes to play pickle ball.  Doing overhead motion and going back hurts his shoulder.               ROS: All systems reviewed are negative as they relate to the chief complaint within the history of present illness.  Patient denies fevers or chills.  Assessment & Plan: Visit Diagnoses:  1. Chronic right shoulder pain     Plan: Impression is mild AC joint arthritis with no definite structural problem noted on exam or radiographs of the shoulder.  Cervical spine radiographs do demonstrate some degenerative disc disease.  Elbow radiographs unremarkable.  In general I think it is most likely that this could be radicular in nature.  Plan is observation at this time.  Could consider diagnostic and therapeutic injections in the shoulder and/or neck after MRI scanning if indicated.  He wants to wait it out for now.  Follow-Up Instructions: No follow-ups on file.   Orders:  Orders Placed This Encounter  Procedures   XR Cervical Spine 2 or 3 views   XR Elbow Complete Right (3+View)   No orders of the defined types were  placed in this encounter.     Procedures: No procedures performed   Clinical Data: No additional findings.  Objective: Vital Signs: There were no vitals taken for this visit.  Physical Exam:  Constitutional: Patient appears well-developed HEENT:  Head: Normocephalic Eyes:EOM are normal Neck: Normal range of motion Cardiovascular: Normal rate Pulmonary/chest: Effort normal Neurologic: Patient is alert Skin: Skin is warm Psychiatric: Patient has normal mood and affect  Ortho Exam: Ortho exam demonstrates full range of motion of the right elbow.  Right shoulder exam demonstrates full active and passive range of motion with no discrete AC joint tenderness right versus left.  No significant crepitus with internal/external rotation of that right shoulder.  No masses lymphadenopathy or skin changes noted in the right shoulder girdle region. Patient has bilateral 5 out of 5 grip EPL FPL interosseous wrist flexion wrist extension bicep triceps and deltoid strength.  Bilateral palpable radial pulses and no paresthesias C5-T1 in either arm.  Neck range of motion flexion chin to chest with extension approximately 50 degrees with approximately 50 degrees of rotation bilaterally.  No masses lymphadenopathy or skin changes around the neck or shoulder girdle region bilaterally   Specialty Comments:  No specialty comments available.  Imaging: XR Elbow Complete Right (3+View) Result Date: 01/13/2024  AP lateral beak radiographs right elbow reviewed.  No acute fracture.  Minimal degenerative changes in the ulnohumeral and radiocapitellar joint.  No effusion in the elbow joint.  XR Cervical Spine 2 or 3 views Result Date: 01/13/2024 AP lateral radiographs cervical spine reviewed.  Degenerative disc disease is present at C4-5 C5-6 and C6-7 with loss of lordosis and anterior spurring at those levels.  No acute compression fractures or spondylolisthesis.    PMFS History: Patient Active Problem List    Diagnosis Date Noted   Diabetic lipidosis (HCC) 12/26/2021   Crohn's disease of large intestine without complication (HCC) 12/26/2021   Past Medical History:  Diagnosis Date   Crohn's disease (HCC)    Diabetes mellitus without complication (HCC)    Sleep apnea     Family History  Problem Relation Age of Onset   Alcohol abuse Mother    Diabetes Mother    Cancer Father    Diabetes Sister    Diabetes Brother     Past Surgical History:  Procedure Laterality Date   arthroscopic knee surgery Bilateral    Social History   Occupational History   Occupation: Retired  Tobacco Use   Smoking status: Every Day    Current packs/day: 1.00    Average packs/day: 1 pack/day for 30.0 years (30.0 ttl pk-yrs)    Types: Cigarettes   Smokeless tobacco: Never  Vaping Use   Vaping status: Never Used  Substance and Sexual Activity   Alcohol use: Not Currently    Comment: rare   Drug use: Never   Sexual activity: Not on file

## 2024-01-26 NOTE — Progress Notes (Signed)
 Attempted to call patient to schedule annual LDCT. Unable to reach patient directly. Detailed VM left asking that the patient return my call.

## 2024-02-03 DIAGNOSIS — K5090 Crohn's disease, unspecified, without complications: Secondary | ICD-10-CM | POA: Diagnosis not present

## 2024-02-09 IMAGING — CT CT CHEST LUNG CANCER SCREENING LOW DOSE W/O CM
2 of 4 series · 15 of 36 positions shown, 18 images · non-contrast
Comparison: None.

CLINICAL DATA: Lung cancer screening. Current asymptomatic smoker
with 39 pack-year history.



[Series 4: lungs · axial · 0.68mm/px · z∈[+1291,+1543]mm · 12 of 278 slices shown, 15 images]
[im 13/278  mediastinal]
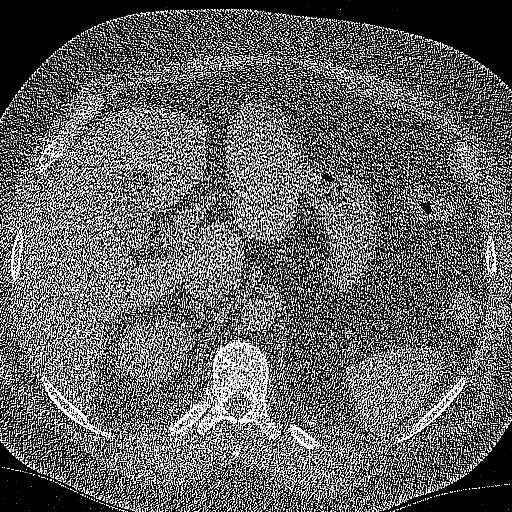
[im 13/278  lung]
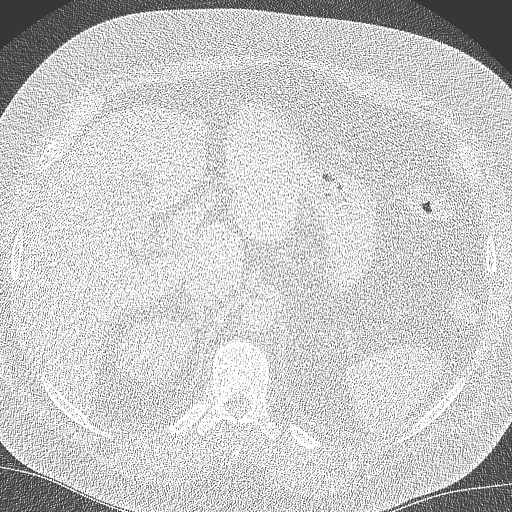
[im 38/278  lung]
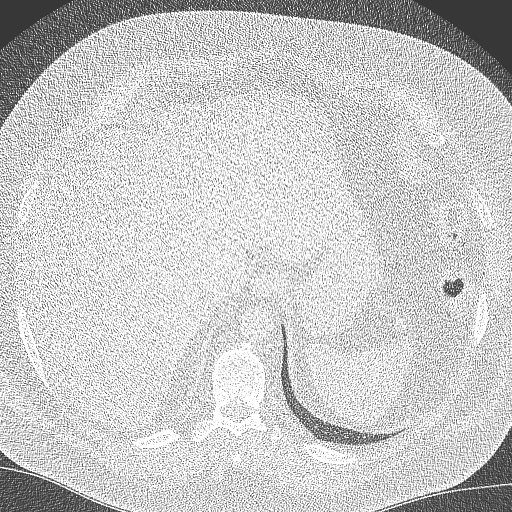
[im 63/278  lung]
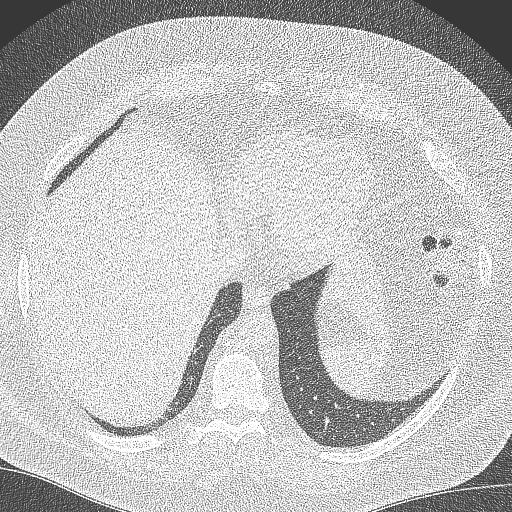
[im 89/278  lung]
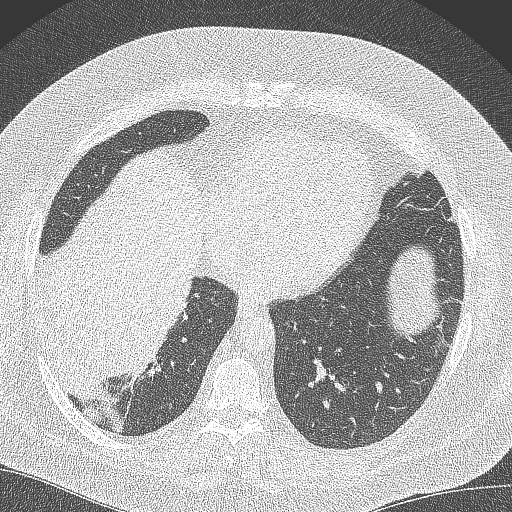
[im 101/278  mediastinal]
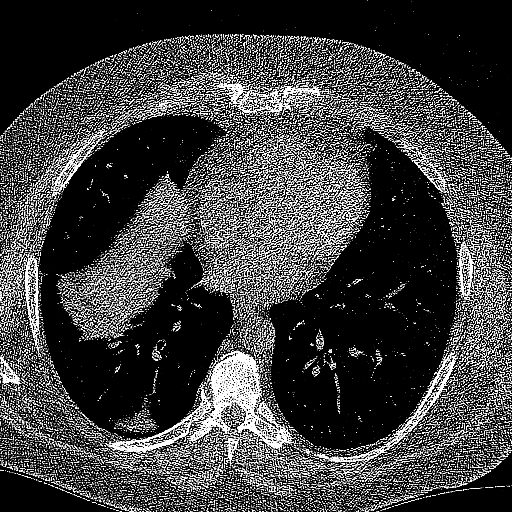
[im 101/278  lung]
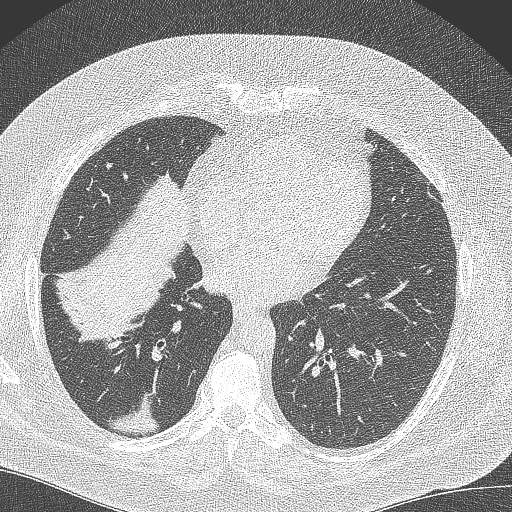
[im 126/278  lung]
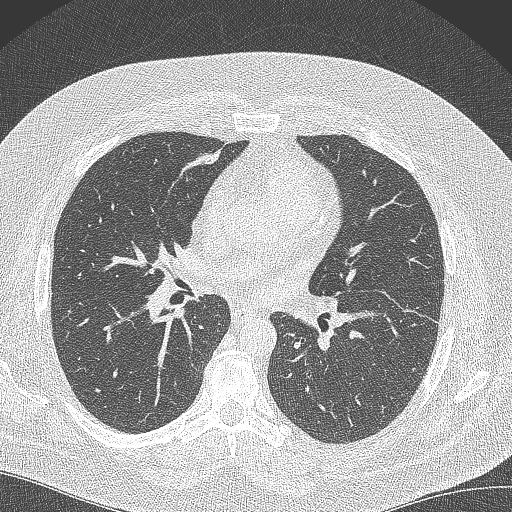
[im 152/278  lung]
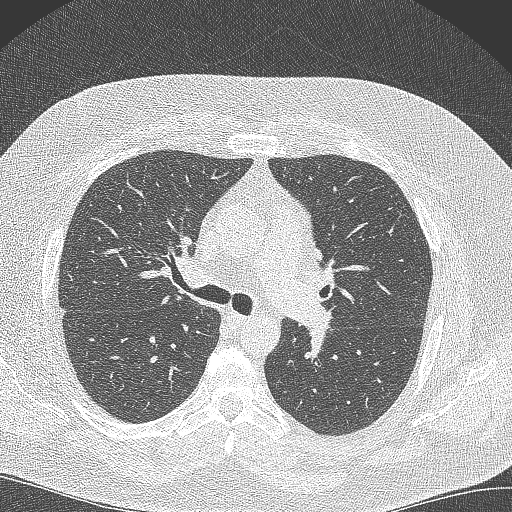
[im 177/278  lung]
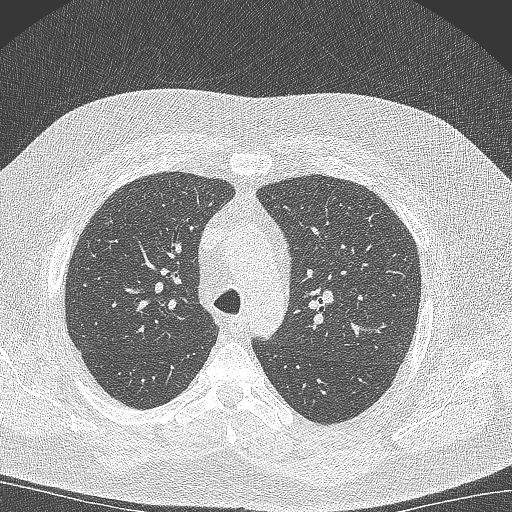
[im 189/278  mediastinal]
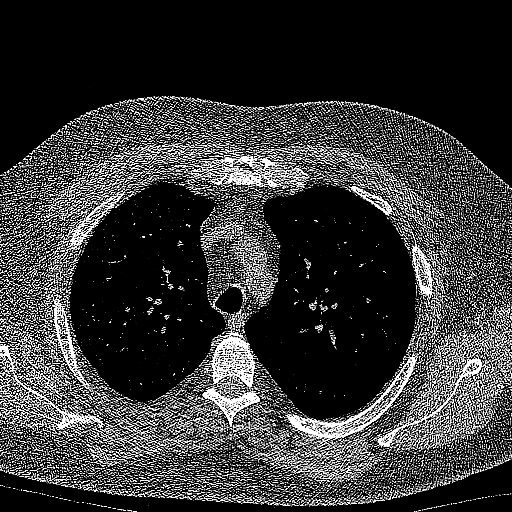
[im 189/278  lung]
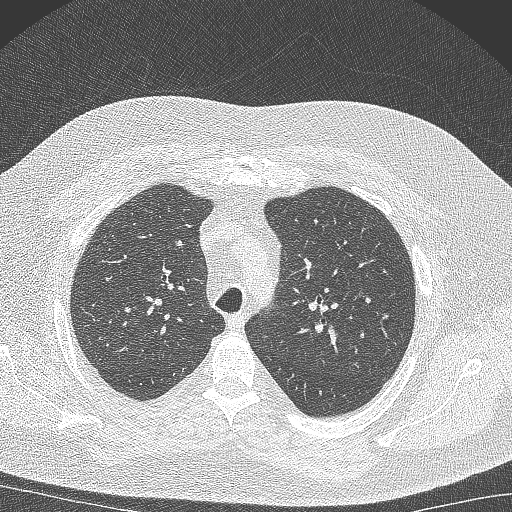
[im 215/278  lung]
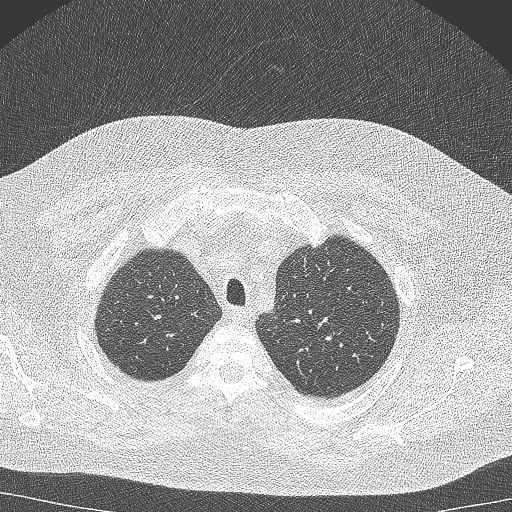
[im 240/278  lung]
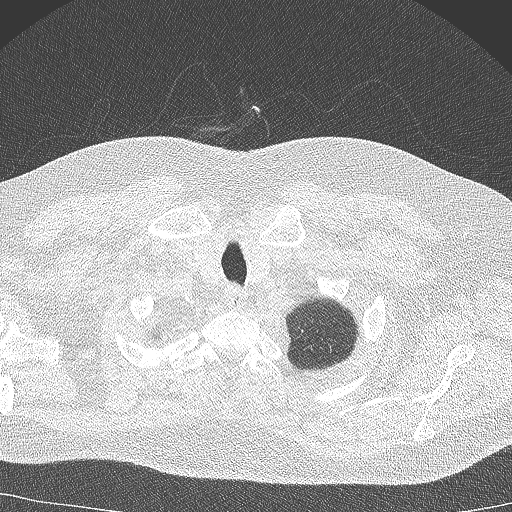
[im 265/278  lung]
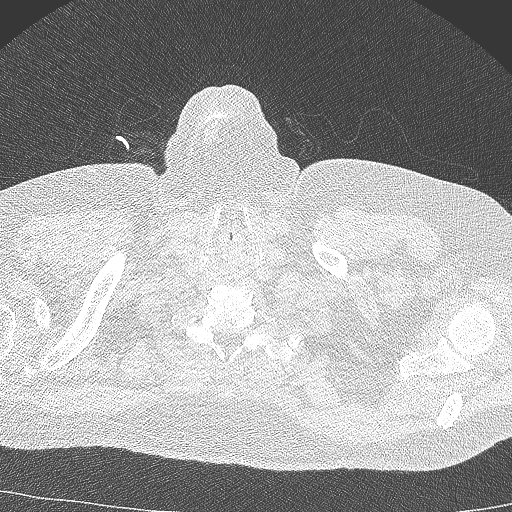

[Series 5: coronal · coronal · 0.60mm/px · 3 of 342 slices shown]
[im 69/342  lung]
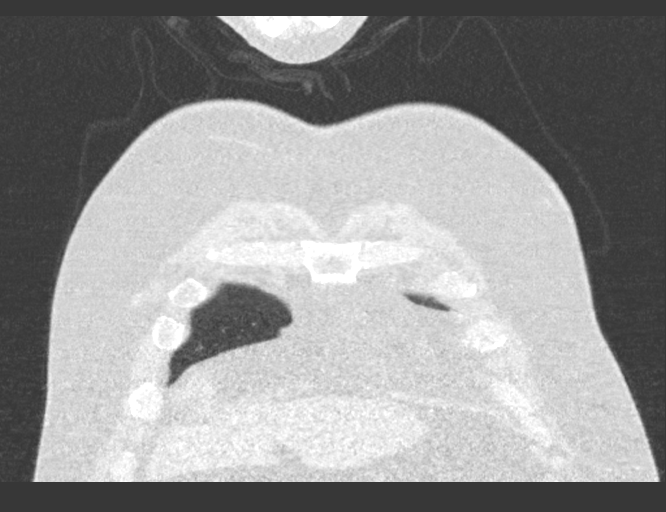
[im 137/342  lung]
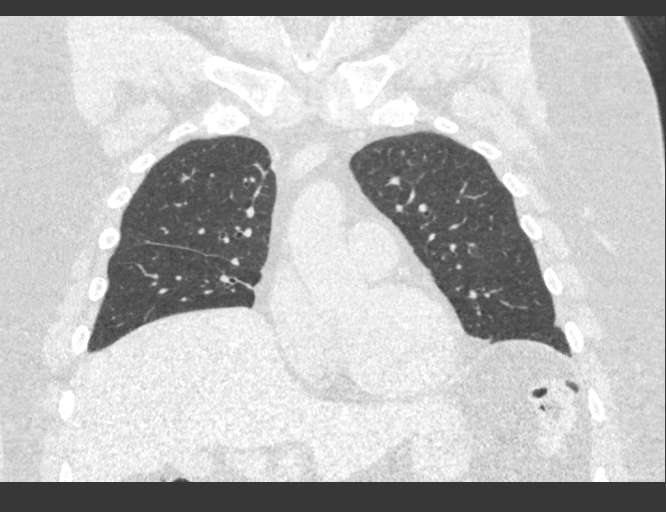
[im 205/342  lung]
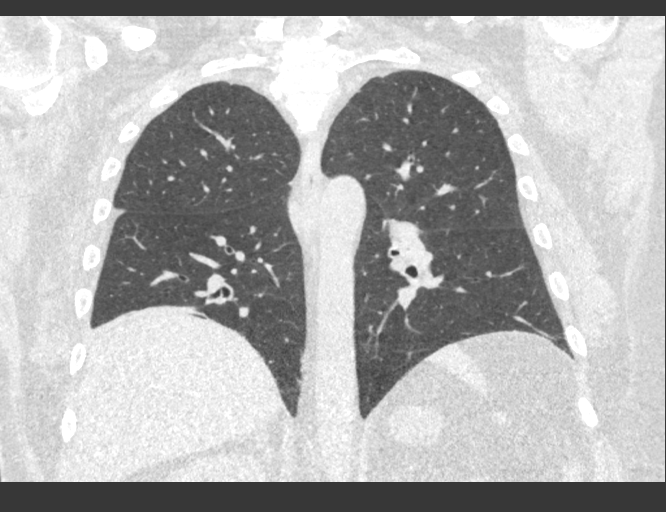

[15 of 36 positions shown; findings below may reference images not displayed]

FINDINGS: Cardiovascular: Heart size is normal. Mild aortic atherosclerosis
and coronary artery calcifications. No pericardial effusion
identified.

Mediastinum/Nodes: No enlarged mediastinal, hilar, or axillary lymph
nodes. Thyroid gland, trachea, and esophagus demonstrate no
significant findings.

Lungs/Pleura: Mild centrilobular emphysema. Scar versus subsegmental
atelectasis identified within the anterior basal right upper lobe
and posterior basal right lower lobe. There is a small nodule in the
right middle lobe which has a mean derived diameter of 5.1 mm. No
additional lung nodules.

Upper Abdomen: No acute abnormality.

Musculoskeletal: No acute or suspicious osseous findings. Mild
spondylosis within the thoracic spine.
IMPRESSION: 1. Lung-RADS 2, benign appearance or behavior. Continue annual
screening with low-dose chest CT without contrast in 12 months.
2. Coronary artery calcifications.
3. Aortic Atherosclerosis (H7KFT-A4M.M) and Emphysema (H7KFT-ECG.G).

## 2024-02-12 NOTE — Progress Notes (Signed)
 Attempted to call patient to schedule annual LDCT. Unable to reach patient directly. Detailed VM left asking that the patient return my call.

## 2024-03-10 ENCOUNTER — Encounter: Payer: Self-pay | Admitting: *Deleted

## 2024-03-15 DIAGNOSIS — K5000 Crohn's disease of small intestine without complications: Secondary | ICD-10-CM | POA: Diagnosis not present

## 2024-03-18 DIAGNOSIS — K5090 Crohn's disease, unspecified, without complications: Secondary | ICD-10-CM | POA: Diagnosis not present

## 2024-03-20 ENCOUNTER — Other Ambulatory Visit: Payer: Self-pay | Admitting: Family Medicine

## 2024-03-24 ENCOUNTER — Ambulatory Visit: Admitting: Family Medicine

## 2024-03-24 ENCOUNTER — Encounter: Payer: Self-pay | Admitting: Family Medicine

## 2024-03-24 ENCOUNTER — Ambulatory Visit: Payer: Self-pay | Admitting: Family Medicine

## 2024-03-24 VITALS — BP 131/71 | HR 74 | Temp 98.0°F | Ht 71.0 in | Wt 264.0 lb

## 2024-03-24 DIAGNOSIS — E756 Lipid storage disorder, unspecified: Secondary | ICD-10-CM

## 2024-03-24 DIAGNOSIS — E119 Type 2 diabetes mellitus without complications: Secondary | ICD-10-CM

## 2024-03-24 DIAGNOSIS — R7309 Other abnormal glucose: Secondary | ICD-10-CM | POA: Diagnosis not present

## 2024-03-24 DIAGNOSIS — E1169 Type 2 diabetes mellitus with other specified complication: Secondary | ICD-10-CM | POA: Diagnosis not present

## 2024-03-24 DIAGNOSIS — K5010 Crohn's disease of large intestine without complications: Secondary | ICD-10-CM | POA: Diagnosis not present

## 2024-03-24 DIAGNOSIS — Z7985 Long-term (current) use of injectable non-insulin antidiabetic drugs: Secondary | ICD-10-CM

## 2024-03-24 LAB — LIPID PANEL

## 2024-03-24 LAB — BAYER DCA HB A1C WAIVED: HB A1C (BAYER DCA - WAIVED): 6.5 % — ABNORMAL HIGH (ref 4.8–5.6)

## 2024-03-24 NOTE — Progress Notes (Unsigned)
 Subjective:  [name],  male    DOB: 17-Oct-1961  Age: 63 y.o.    CC: Medical Management of Chronic Issues   HPI Mitchell Garza presents for  follow-up of hypertension. Patient has no history of headache chest pain or shortness of breath or recent cough. Patient also denies symptoms of TIA such as numbness weakness lateralizing. Patient denies side effects from medication. States taking it regularly.  Patient also  in for follow-up of elevated cholesterol. Doing well without complaints on current medication. Denies side effects  including myalgia and arthralgia and nausea. Also in today for liver function testing. Currently no chest pain, shortness of breath or other cardiovascular related symptoms noted.  Follow-up of diabetes. Patient does check blood sugar at home. Readings run between 120 and 130 BOTH FASTING AND AFTER MEALS.  Patient denies symptoms such as excessive hunger or urinary frequency, excessive hunger, nausea No significant hypoglycemic spells noted. Medications reviewed. Pt reports taking them regularly. Pt. denies complication/adverse reaction today.  Patient is seeing GI and taking Remicade for his Crohn's disease of the colon.  It has been essentially quiescent recently, since last evaluation.  History Mitchell Garza has a past medical history of Crohn's disease (HCC), Diabetes mellitus without complication (HCC), and Sleep apnea.   Mitchell Garza has a past surgical history that includes arthroscopic knee surgery (Bilateral).   His family history includes Alcohol abuse in his mother; Cancer in his father; Diabetes in his brother, mother, and sister.Mitchell Garza reports that Mitchell Garza has been smoking cigarettes. Mitchell Garza has a 30 pack-year smoking history. Mitchell Garza has never used smokeless tobacco. Mitchell Garza reports that Mitchell Garza does not currently use alcohol. Mitchell Garza reports that Mitchell Garza does not use drugs.  Current Outpatient Medications on File Prior to Visit  Medication Sig Dispense Refill   blood glucose meter kit  and supplies KIT Dispense based on patient and insurance preference. Use up to four times daily as directed. (FOR ICD-10 : E11.9 1 each 9   inFLIXimab  (REMICADE) 100 MG injection Inject into the vein every 6 (six) weeks.     traZODone  (DESYREL ) 150 MG tablet USE FROM 1/3 TO 1 TABLET NIGHTLY AS NEEDED FOR SLEEP. 90 tablet 3   No current facility-administered medications on file prior to visit.    ROS Review of Systems  Constitutional:  Negative for fever.  Respiratory:  Negative for shortness of breath.   Cardiovascular:  Negative for chest pain.  Musculoskeletal:  Negative for arthralgias.  Skin:  Negative for rash.    Objective:  BP 131/71   Pulse 74   Temp 98 F (36.7 C)   Ht 5\' 11"  (1.803 m)   Wt 264 lb (119.7 kg)   SpO2 94%   BMI 36.82 kg/m   BP Readings from Last 3 Encounters:  03/24/24 131/71  12/23/23 139/80  08/25/23 129/67    Wt Readings from Last 3 Encounters:  03/24/24 264 lb (119.7 kg)  12/23/23 266 lb (120.7 kg)  08/25/23 261 lb 3.2 oz (118.5 kg)    Lab Results  Component Value Date   HGBA1C 6.5 (H) 03/24/2024   HGBA1C 6.3 (H) 12/23/2023   HGBA1C 7.1 (H) 08/25/2023    Physical Exam Vitals reviewed.  Constitutional:      Appearance: Mitchell Garza is well-developed.  HENT:     Head: Normocephalic and atraumatic.     Right Ear: External ear normal.     Left Ear: External ear normal.     Mouth/Throat:     Pharynx: No oropharyngeal  exudate or posterior oropharyngeal erythema.  Eyes:     Pupils: Pupils are equal, round, and reactive to light.  Cardiovascular:     Rate and Rhythm: Normal rate and regular rhythm.     Heart sounds: No murmur heard. Pulmonary:     Effort: No respiratory distress.     Breath sounds: Normal breath sounds.  Musculoskeletal:     Cervical back: Normal range of motion and neck supple.  Neurological:     Mental Status: Mitchell Garza is alert and oriented to person, place, and time.         Assessment & Plan:  Diabetic lipidosis  (HCC) -     Bayer DCA Hb A1c Waived -     CMP14+EGFR -     Lipid panel  Diabetes mellitus without complication (HCC) -     Bayer DCA Hb A1c Waived -     CMP14+EGFR  Crohn's disease of large intestine without complication (HCC) -     CBC with Differential/Platelet  Other orders -     Trulicity ; Inject 3 mg as directed once a week.  Dispense: 6 mL; Refill: 1 -     Valsartan ; Take 1 tablet (320 mg total) by mouth daily. For blood pressure and kidney protection  Dispense: 90 tablet; Refill: 1 -     Pioglitazone  HCl; Take 1 tablet (45 mg total) by mouth daily.  Dispense: 90 tablet; Refill: 0 -     Simvastatin ; Take 1 tablet (40 mg total) by mouth daily.  Dispense: 90 tablet; Refill: 0 -     Sildenafil  Citrate; Take 1 tablet (20 mg total) by mouth daily as needed.  Dispense: 10 tablet; Refill: 5 -     metFORMIN  HCl ER; TAKE 4 TABLETS BY MOUTH DAILY WITH BREAKFAST.  Dispense: 360 tablet; Refill: 0 -     amLODIPine Besylate; Take 1 tablet (5 mg total) by mouth daily. For blood pressure  Dispense: 30 tablet; Refill: 5    Follow-up: No follow-ups on file.  Roise Cleaver, M.D.

## 2024-03-25 ENCOUNTER — Encounter: Payer: Self-pay | Admitting: Family Medicine

## 2024-03-25 MED ORDER — PIOGLITAZONE HCL 45 MG PO TABS: 45.0000 mg | ORAL_TABLET | Freq: Every day | ORAL | 0 refills | Status: DC

## 2024-03-25 MED ORDER — METFORMIN HCL ER 500 MG PO TB24: ORAL_TABLET | ORAL | 0 refills | Status: DC

## 2024-03-25 MED ORDER — SIMVASTATIN 40 MG PO TABS: 40.0000 mg | ORAL_TABLET | Freq: Every day | ORAL | 0 refills | Status: DC

## 2024-03-25 MED ORDER — SILDENAFIL CITRATE 20 MG PO TABS: 20.0000 mg | ORAL_TABLET | Freq: Every day | ORAL | 5 refills | Status: AC | PRN

## 2024-03-25 MED ORDER — TRULICITY 3 MG/0.5ML ~~LOC~~ SOAJ: 3.0000 mg | SUBCUTANEOUS | 1 refills | Status: DC

## 2024-03-25 MED ORDER — AMLODIPINE BESYLATE 5 MG PO TABS: 5.0000 mg | ORAL_TABLET | Freq: Every day | ORAL | 5 refills | Status: DC

## 2024-03-25 MED ORDER — VALSARTAN 320 MG PO TABS: 320.0000 mg | ORAL_TABLET | Freq: Every day | ORAL | 1 refills | Status: DC

## 2024-03-28 LAB — CMP14+EGFR
ALT: 27 IU/L (ref 0–44)
AST: 19 IU/L (ref 0–40)
Albumin: 4.3 g/dL (ref 3.9–4.9)
Alkaline Phosphatase: 50 IU/L (ref 44–121)
BUN/Creatinine Ratio: 12 (ref 10–24)
BUN: 12 mg/dL (ref 8–27)
Bilirubin Total: 0.5 mg/dL (ref 0.0–1.2)
CO2: 24 mmol/L (ref 20–29)
Calcium: 9.6 mg/dL (ref 8.6–10.2)
Chloride: 102 mmol/L (ref 96–106)
Creatinine, Ser: 0.98 mg/dL (ref 0.76–1.27)
Globulin, Total: 2.7 g/dL (ref 1.5–4.5)
Glucose: 106 mg/dL — ABNORMAL HIGH (ref 70–99)
Potassium: 5.4 mmol/L — ABNORMAL HIGH (ref 3.5–5.2)
Sodium: 141 mmol/L (ref 134–144)
Total Protein: 7 g/dL (ref 6.0–8.5)
eGFR: 87 mL/min/{1.73_m2} (ref >59)

## 2024-03-28 LAB — CBC WITH DIFFERENTIAL/PLATELET
Basophils Absolute: 0 10*3/uL (ref 0.0–0.2)
Basos: 1 %
EOS (ABSOLUTE): 0.1 10*3/uL (ref 0.0–0.4)
Eos: 2 %
Hematocrit: 41.5 % (ref 37.5–51.0)
Hemoglobin: 13.8 g/dL (ref 13.0–17.7)
Immature Grans (Abs): 0 10*3/uL (ref 0.0–0.1)
Immature Granulocytes: 0 %
Lymphocytes Absolute: 2.3 10*3/uL (ref 0.7–3.1)
Lymphs: 37 %
MCH: 31.6 pg (ref 26.6–33.0)
MCHC: 33.3 g/dL (ref 31.5–35.7)
MCV: 95 fL (ref 79–97)
Monocytes Absolute: 0.4 10*3/uL (ref 0.1–0.9)
Monocytes: 7 %
Neutrophils Absolute: 3.3 10*3/uL (ref 1.4–7.0)
Neutrophils: 53 %
Platelets: 265 10*3/uL (ref 150–450)
RBC: 4.37 x10E6/uL (ref 4.14–5.80)
RDW: 13.3 % (ref 11.6–15.4)
WBC: 6.1 10*3/uL (ref 3.4–10.8)

## 2024-03-28 LAB — LIPID PANEL
Chol/HDL Ratio: 1.9 ratio (ref 0.0–5.0)
Cholesterol, Total: 119 mg/dL (ref 100–199)
HDL: 64 mg/dL (ref >39)
LDL Chol Calc (NIH): 42 mg/dL (ref 0–99)
Triglycerides: 61 mg/dL (ref 0–149)
VLDL Cholesterol Cal: 13 mg/dL (ref 5–40)

## 2024-03-28 LAB — HM DIABETES EYE EXAM

## 2024-03-28 NOTE — Progress Notes (Signed)
Hello Mitchell Garza,  Your lab result is normal and/or stable.Some minor variations that are not significant are commonly marked abnormal, but do not represent any medical problem for you.  Best regards, Jesten Cappuccio, M.D.

## 2024-04-20 ENCOUNTER — Other Ambulatory Visit: Payer: Self-pay | Admitting: *Deleted

## 2024-04-20 DIAGNOSIS — Z122 Encounter for screening for malignant neoplasm of respiratory organs: Secondary | ICD-10-CM

## 2024-04-20 DIAGNOSIS — Z87891 Personal history of nicotine dependence: Secondary | ICD-10-CM

## 2024-04-20 NOTE — Progress Notes (Signed)
 Orders placed for LDCT follow-up exam.  Recommended in 6/24 to follow up in 12 months.  I spoke with patient and he wishes to continue to the yearly screenings. He is aware of his appt date and time.

## 2024-05-03 DIAGNOSIS — K5090 Crohn's disease, unspecified, without complications: Secondary | ICD-10-CM | POA: Diagnosis not present

## 2024-05-03 DIAGNOSIS — Z79899 Other long term (current) drug therapy: Secondary | ICD-10-CM | POA: Diagnosis not present

## 2024-05-04 ENCOUNTER — Encounter: Payer: Self-pay | Admitting: Physician Assistant

## 2024-05-04 ENCOUNTER — Ambulatory Visit: Admitting: Physician Assistant

## 2024-05-04 VITALS — BP 125/80

## 2024-05-04 DIAGNOSIS — Z1283 Encounter for screening for malignant neoplasm of skin: Secondary | ICD-10-CM | POA: Diagnosis not present

## 2024-05-04 DIAGNOSIS — L570 Actinic keratosis: Secondary | ICD-10-CM | POA: Diagnosis not present

## 2024-05-04 DIAGNOSIS — Z808 Family history of malignant neoplasm of other organs or systems: Secondary | ICD-10-CM

## 2024-05-04 DIAGNOSIS — L820 Inflamed seborrheic keratosis: Secondary | ICD-10-CM

## 2024-05-04 DIAGNOSIS — L578 Other skin changes due to chronic exposure to nonionizing radiation: Secondary | ICD-10-CM

## 2024-05-04 DIAGNOSIS — D2372 Other benign neoplasm of skin of left lower limb, including hip: Secondary | ICD-10-CM

## 2024-05-04 DIAGNOSIS — L814 Other melanin hyperpigmentation: Secondary | ICD-10-CM

## 2024-05-04 DIAGNOSIS — D492 Neoplasm of unspecified behavior of bone, soft tissue, and skin: Secondary | ICD-10-CM | POA: Diagnosis not present

## 2024-05-04 DIAGNOSIS — D239 Other benign neoplasm of skin, unspecified: Secondary | ICD-10-CM

## 2024-05-04 DIAGNOSIS — W908XXA Exposure to other nonionizing radiation, initial encounter: Secondary | ICD-10-CM

## 2024-05-04 DIAGNOSIS — D1801 Hemangioma of skin and subcutaneous tissue: Secondary | ICD-10-CM

## 2024-05-04 DIAGNOSIS — D485 Neoplasm of uncertain behavior of skin: Secondary | ICD-10-CM

## 2024-05-04 DIAGNOSIS — D229 Melanocytic nevi, unspecified: Secondary | ICD-10-CM

## 2024-05-04 DIAGNOSIS — L821 Other seborrheic keratosis: Secondary | ICD-10-CM

## 2024-05-04 NOTE — Progress Notes (Signed)
 New Patient Visit   Subjective  Mitchell Garza is a 63 y.o. male who presents for the following: Skin Cancer Screening and Full Body Skin Exam - Family history of Melanoma - father. Immunosuppressed on Remicade. Retired Emergency planning/management officer.   Few scaly areas on scalp.   The patient presents for Total-Body Skin Exam (TBSE) for skin cancer screening and mole check. The patient has spots, moles and lesions to be evaluated, some may be new or changing and the patient may have concern these could be cancer.    The following portions of the chart were reviewed this encounter and updated as appropriate: medications, allergies, medical history  Review of Systems:  No other skin or systemic complaints except as noted in HPI or Assessment and Plan.  Objective  Well appearing patient in no apparent distress; mood and affect are within normal limits.  A full examination was performed including scalp, head, eyes, ears, nose, lips, neck, chest, axillae, abdomen, back, buttocks, bilateral upper extremities, bilateral lower extremities, hands, feet, fingers, toes, fingernails, and toenails. All findings within normal limits unless otherwise noted below.   Relevant physical exam findings are noted in the Assessment and Plan.  Nose (2) Erythematous thin papules/macules with gritty scale.  Left scalp 1.4 cm hyperkeratotic plaque   Assessment & Plan   SKIN CANCER SCREENING PERFORMED TODAY.  ACTINIC DAMAGE - Chronic condition, secondary to cumulative UV/sun exposure - diffuse scaly erythematous macules with underlying dyspigmentation - Recommend daily broad spectrum sunscreen SPF 30+ to sun-exposed areas, reapply every 2 hours as needed.  - Staying in the shade or wearing long sleeves, sun glasses (UVA+UVB protection) and wide brim hats (4-inch brim around the entire circumference of the hat) are also recommended for sun protection.  - Call for new or changing lesions.  LENTIGINES, SEBORRHEIC  KERATOSES, HEMANGIOMAS - Benign normal skin lesions - Benign-appearing - Call for any changes  MELANOCYTIC NEVI - Tan-brown and/or pink-flesh-colored symmetric macules and papules - Benign appearing on exam today - Observation - Call clinic for new or changing moles - Recommend daily use of broad spectrum spf 30+ sunscreen to sun-exposed areas.   FAMILY HISTORY OF MELANOMA.  What type(s): Melanoma Who affected: Father  DERMATOFIBROMA Exam: Firm pink/brown papulenodule with dimple sign of left lower leg. Treatment Plan: A dermatofibroma is a benign growth possibly related to trauma, such as an insect bite, cut from shaving, or inflamed acne-type bump.  Treatment options to remove include shave or excision with resulting scar and risk of recurrence.  Since benign-appearing and not bothersome, will observe for now.      AK (ACTINIC KERATOSIS) (2) Nose (2) Destruction of lesion - Nose (2) Complexity: simple   Destruction method: cryotherapy   Informed consent: discussed and consent obtained   Timeout:  patient name, date of birth, surgical site, and procedure verified Lesion destroyed using liquid nitrogen: Yes   Region frozen until ice ball extended beyond lesion: Yes   Outcome: patient tolerated procedure well with no complications   Post-procedure details: wound care instructions given    NEOPLASM OF UNCERTAIN BEHAVIOR OF SKIN Left scalp Epidermal / dermal shaving  Lesion diameter (cm):  1.4 Informed consent: discussed and consent obtained   Timeout: patient name, date of birth, surgical site, and procedure verified   Procedure prep:  Patient was prepped and draped in usual sterile fashion Prep type:  Isopropyl alcohol Anesthesia: the lesion was anesthetized in a standard fashion   Anesthetic:  1% lidocaine w/ epinephrine 1-100,000  buffered w/ 8.4% NaHCO3 Instrument used: flexible razor blade   Hemostasis achieved with: pressure, aluminum chloride and electrodesiccation    Outcome: patient tolerated procedure well   Post-procedure details: sterile dressing applied and wound care instructions given   Dressing type: bandage and petrolatum    Specimen 1 - Surgical pathology Differential Diagnosis: ISK vs other  Check Margins: No SCREENING EXAM FOR SKIN CANCER   ACTINIC SKIN DAMAGE   LENTIGINES   SEBORRHEIC KERATOSIS   CHERRY ANGIOMA   MULTIPLE BENIGN NEVI   FAMILY HISTORY OF MELANOMA   DERMATOFIBROMA    Return in about 1 year (around 05/04/2025) for TBSE.  I, Roseline Hutchinson, CMA, am acting as scribe for Sebastiana Wuest K, PA-C .   Documentation: I have reviewed the above documentation for accuracy and completeness, and I agree with the above.  Ermine Stebbins K, PA-C

## 2024-05-04 NOTE — Patient Instructions (Addendum)

## 2024-05-05 LAB — SURGICAL PATHOLOGY

## 2024-05-08 ENCOUNTER — Ambulatory Visit: Payer: Self-pay | Admitting: Physician Assistant

## 2024-05-16 ENCOUNTER — Ambulatory Visit (HOSPITAL_COMMUNITY)
Admission: RE | Admit: 2024-05-16 | Discharge: 2024-05-16 | Disposition: A | Discharge status: Home / Self Care | Source: Ambulatory Visit | Attending: Oncology | Admitting: Oncology

## 2024-05-16 ENCOUNTER — Ambulatory Visit (HOSPITAL_COMMUNITY)

## 2024-05-16 DIAGNOSIS — Z87891 Personal history of nicotine dependence: Secondary | ICD-10-CM | POA: Diagnosis not present

## 2024-05-16 DIAGNOSIS — Z122 Encounter for screening for malignant neoplasm of respiratory organs: Secondary | ICD-10-CM | POA: Diagnosis not present

## 2024-06-16 DIAGNOSIS — K5090 Crohn's disease, unspecified, without complications: Secondary | ICD-10-CM | POA: Diagnosis not present

## 2024-06-17 ENCOUNTER — Ambulatory Visit: Admitting: Orthopedic Surgery

## 2024-06-17 DIAGNOSIS — M5412 Radiculopathy, cervical region: Secondary | ICD-10-CM | POA: Diagnosis not present

## 2024-06-18 ENCOUNTER — Encounter: Payer: Self-pay | Admitting: Orthopedic Surgery

## 2024-06-18 NOTE — Progress Notes (Signed)
 Office Visit Note   Patient: Mitchell Garza           Date of Birth: September 22, 1961           MRN: [id number] Visit Date: 06/17/2024 Requested by: Zollie Lowers, MD [address] [place],  [place] [address] PCP: Zollie Lowers, MD  Subjective: Chief Complaint  Patient presents with   Right Shoulder - Pain, Follow-up    HPI: Mitchell Garza is a 63 y.o. male who presents to the office reporting right shoulder and arm pain.  Patient states the pain is worsening with any activity.  Reports decreased range of motion but no pain at rest.  Also reports radicular type nerve pain with numbness and tingling in the arm and hand when he does have shooting pain.  Reaching up his back is problematic.  Overhead motion makes his symptoms worse.  Takes about 30 seconds for his pain to go away.  Denies any mechanical symptoms in the shoulder.  Does report some stiffness in the trapezial region.  Abduction and external rotation hurts him.  Lightening bolt type pain radiates down the arm.  Patient states that 20 years ago he did have a history of C5-6 and C4-5 HNP which resolved with an injection..                ROS: All systems reviewed are negative as they relate to the chief complaint within the history of present illness.  Patient denies fevers or chills.  Assessment & Plan: Visit Diagnoses:  1. Radiculopathy, cervical region     Plan: Impression is likely right sided radicular pain.  Shoulder exam has a little bit of decreased range of motion but rotator cuff strength is good and biceps testing is not symptomatic.  Symptoms ongoing now for since his last clinic visit 5 months ago.  Recommendation at this time is MRI cervical spine for right-sided radiculopathy primarily based on his current level of symptoms as well as his history of prior problem on that side which was resolved with epidural steroid injection.  Follow-up after that study and we will likely get him set up to see Dr. Eldonna for [facility].  Follow-Up  Instructions: No follow-ups on file.   Orders:  Orders Placed This Encounter  Procedures   MR Cervical Spine w/o contrast   No orders of the defined types were placed in this encounter.     Procedures: No procedures performed   Clinical Data: No additional findings.  Objective: Vital Signs: There were no vitals taken for this visit.  Physical Exam:  Constitutional: Patient appears well-developed HEENT:  Head: Normocephalic Eyes:EOM are normal Neck: Normal range of motion Cardiovascular: Normal rate Pulmonary/chest: Effort normal Neurologic: Patient is alert Skin: Skin is warm Psychiatric: Patient has normal mood and affect  Ortho Exam: Ortho exam demonstrates full active and passive range of motion of his neck.  A little bit of pain with rotation to the right.  5 out of 5 grip EPL FPL interosseous wrist lection extension biceps triceps and deltoid strength.  Mild paresthesias today C6 distribution right versus left.  Reflexes symmetric 0 to 1+ out of 4 bilateral biceps and triceps.  Radial pulse intact bilaterally.  Excellent rotator cuff strength infraspinatus supraspinatus and subscap muscle testing with no real coarseness or grinding with internal/external rotation of that right arm.  Negative O'Brien's testing and no discrete AC joint tenderness right versus left.  Specialty Comments:  No specialty comments available.  Imaging: No results found.  PMFS History: Patient Active Problem List   Diagnosis Date Noted   Diabetic lipidosis (HCC) 12/26/2021   Crohn's disease of large intestine without complication (HCC) 12/26/2021   Past Medical History:  Diagnosis Date   Crohn's disease (HCC)    Diabetes mellitus without complication (HCC)    Sleep apnea     Family History  Problem Relation Age of Onset   Alcohol abuse Mother    Diabetes Mother    Cancer Father    Diabetes Sister    Diabetes Brother     Past Surgical History:  Procedure Laterality Date    arthroscopic knee surgery Bilateral    Social History   Occupational History   Occupation: Retired  Tobacco Use   Smoking status: Every Day    Current packs/day: 1.00    Average packs/day: 1 pack/day for 30.0 years (30.0 ttl pk-yrs)    Types: Cigarettes   Smokeless tobacco: Never  Vaping Use   Vaping status: Never Used  Substance and Sexual Activity   Alcohol use: Not Currently    Comment: rare   Drug use: Never   Sexual activity: Not on file

## 2024-06-28 ENCOUNTER — Ambulatory Visit: Admitting: Family Medicine

## 2024-06-28 ENCOUNTER — Encounter: Payer: Self-pay | Admitting: Family Medicine

## 2024-06-28 ENCOUNTER — Ambulatory Visit: Payer: Self-pay | Admitting: Family Medicine

## 2024-06-28 VITALS — BP 119/73 | HR 89 | Temp 98.5°F | Ht 71.0 in | Wt 248.0 lb

## 2024-06-28 DIAGNOSIS — Z125 Encounter for screening for malignant neoplasm of prostate: Secondary | ICD-10-CM | POA: Diagnosis not present

## 2024-06-28 DIAGNOSIS — E756 Lipid storage disorder, unspecified: Secondary | ICD-10-CM

## 2024-06-28 DIAGNOSIS — E119 Type 2 diabetes mellitus without complications: Secondary | ICD-10-CM | POA: Diagnosis not present

## 2024-06-28 DIAGNOSIS — Z7985 Long-term (current) use of injectable non-insulin antidiabetic drugs: Secondary | ICD-10-CM | POA: Diagnosis not present

## 2024-06-28 DIAGNOSIS — E1169 Type 2 diabetes mellitus with other specified complication: Secondary | ICD-10-CM

## 2024-06-28 DIAGNOSIS — Z23 Encounter for immunization: Secondary | ICD-10-CM

## 2024-06-28 DIAGNOSIS — Z13220 Encounter for screening for lipoid disorders: Secondary | ICD-10-CM

## 2024-06-28 LAB — CMP14+EGFR
ALT: 24 IU/L (ref 0–44)
AST: 19 IU/L (ref 0–40)
Albumin: 4.2 g/dL (ref 3.9–4.9)
Alkaline Phosphatase: 54 IU/L (ref 44–121)
BUN/Creatinine Ratio: 14 (ref 10–24)
BUN: 12 mg/dL (ref 8–27)
Bilirubin Total: 0.6 mg/dL (ref 0.0–1.2)
CO2: 26 mmol/L (ref 20–29)
Calcium: 9.9 mg/dL (ref 8.6–10.2)
Chloride: 103 mmol/L (ref 96–106)
Creatinine, Ser: 0.83 mg/dL (ref 0.76–1.27)
Globulin, Total: 2.8 g/dL (ref 1.5–4.5)
Glucose: 109 mg/dL — ABNORMAL HIGH (ref 70–99)
Potassium: 4.9 mmol/L (ref 3.5–5.2)
Sodium: 142 mmol/L (ref 134–144)
Total Protein: 7 g/dL (ref 6.0–8.5)
eGFR: 99 mL/min/1.73 (ref >59)

## 2024-06-28 LAB — LIPID PANEL

## 2024-06-28 LAB — BAYER DCA HB A1C WAIVED: HB A1C (BAYER DCA - WAIVED): 6.2 % — ABNORMAL HIGH (ref 4.8–5.6)

## 2024-06-28 MED ORDER — PIOGLITAZONE HCL 30 MG PO TABS: 30.0000 mg | ORAL_TABLET | Freq: Every day | ORAL | 1 refills | Status: AC

## 2024-06-28 NOTE — Progress Notes (Signed)
 Subjective:  [name], male    DOB: 03-Aug-1961  Age: 64 y.o. MRN: [id number]  CC: Medical Management of Chronic Issues (3 month follow up/)   HPI  Discussed the use of AI scribe software for clinical note transcription with the patient, who gave verbal consent to proceed.  History of Present Illness Mitchell Garza is a 63 year old male with diabetes who presents for a routine diabetes checkup.  He has stable blood sugar levels at home and has not experienced hypoglycemia since discontinuing glimepiride . He is concerned about potential low blood sugar levels due to his current A1c of 6.2%.  He has lost approximately 16 pounds over the past three months, which he attributes to a change in medication from glimepiride  to dulaglutide  (Trulicity ) and daily exercise. His appetite has decreased, and he is pleased with the weight loss, although he initially felt it was slow.  He experiences frequent urination, which he attributes to high water intake. He is currently taking Diovan  and is aware of the need for regular kidney function monitoring. A urine specimen was collected during this visit.  His current medications include dulaglutide  (Trulicity ) and pioglitazone . He is monitoring for potential side effects such as weight gain and swelling.  No swelling, heartburn, or indigestion. He reports feeling well overall and states, 'This was probably the best I felt in a while.'          06/28/2024   10:38 AM 08/25/2023    3:10 PM 04/02/2023   10:09 AM  Depression screen PHQ 2/9  Decreased Interest 0 0 0  Down, Depressed, Hopeless 0 0 0  PHQ - 2 Score 0 0 0    History Mitchell Garza has a past medical history of Crohn's disease (HCC), Diabetes mellitus without complication (HCC), and Sleep apnea.   He has a past surgical history that includes arthroscopic knee surgery (Bilateral).   His family history includes Alcohol abuse in his mother; Cancer in his father; Diabetes in his  brother, mother, and sister.He reports that he has been smoking cigarettes. He has a 30 pack-year smoking history. He has never used smokeless tobacco. He reports that he does not currently use alcohol. He reports that he does not use drugs.    ROS Review of Systems  Constitutional:  Negative for fever.  Respiratory:  Negative for shortness of breath.   Cardiovascular:  Negative for chest pain.  Musculoskeletal:  Negative for arthralgias.  Skin:  Negative for rash.    Objective:  BP 119/73   Pulse 89   Temp 98.5 F (36.9 C)   Ht 5' 11 (1.803 m)   Wt 248 lb (112.5 kg)   SpO2 97%   BMI 34.59 kg/m   BP Readings from Last 3 Encounters:  06/28/24 119/73  05/04/24 125/80  03/24/24 131/71    Wt Readings from Last 3 Encounters:  06/28/24 248 lb (112.5 kg)  03/24/24 264 lb (119.7 kg)  12/23/23 266 lb (120.7 kg)     Physical Exam Vitals reviewed.  Constitutional:      Appearance: He is well-developed.  HENT:     Head: Normocephalic and atraumatic.     Right Ear: External ear normal.     Left Ear: External ear normal.     Mouth/Throat:     Pharynx: No oropharyngeal exudate or posterior oropharyngeal erythema.  Eyes:     Pupils: Pupils are equal, round, and reactive to light.  Cardiovascular:     Rate and Rhythm: Normal rate  and regular rhythm.     Heart sounds: No murmur heard. Pulmonary:     Effort: No respiratory distress.     Breath sounds: Normal breath sounds.  Musculoskeletal:     Cervical back: Normal range of motion and neck supple.  Neurological:     Mental Status: He is alert and oriented to person, place, and time.    Physical Exam VITALS: P- 80 GENERAL: Alert, cooperative, well developed, no acute distress. HEENT: Normocephalic, normal oropharynx, moist mucous membranes. CHEST: Clear to auscultation bilaterally, no wheezes, rhonchi, or crackles. CARDIOVASCULAR: Regular rate and rhythm, S1 and S2 normal without murmurs, no carotid bruits. ABDOMEN:  Soft, non-tender, non-distended, without organomegaly, normal bowel sounds. EXTREMITIES: No cyanosis or edema. NEUROLOGICAL: Cranial nerves grossly intact, moves all extremities without gross motor or sensory deficit.    Assessment & Plan:  Screening PSA (prostate specific antigen) -     PSA, total and free  Diabetes mellitus without complication (HCC) -     CMP14+EGFR -     Bayer DCA Hb A1c Waived -     CBC with Differential/Platelet -     Microalbumin / creatinine urine ratio  Lipid screening -     Lipid panel  Diabetic lipidosis (HCC) -     Lipid panel  Encounter for immunization -     Flu vaccine trivalent PF, 6mos and older(Flulaval,Afluria,Fluarix,Fluzone)  Other orders -     Pioglitazone  HCl; Take 1 tablet (30 mg total) by mouth daily.  Dispense: 90 tablet; Refill: 1    Assessment and Plan Assessment & Plan Type 2 diabetes mellitus   Type 2 diabetes mellitus is well-controlled with an A1c of 6.2%. He has not experienced hypoglycemia since stopping glimepiride . A 16-pound weight loss over the past three months is likely due to switching from glimepiride  to dulaglutide  (Trulicity ). Reduce pioglitazone  to 30 mg and monitor A1c. Consider further reduction to 15 mg if A1c remains stable. Continue dulaglutide  at 3 mg, with a possible increase to 4.5 mg for additional weight loss and glycemic control. Ensure the pharmacy dispenses the correct pioglitazone  dose and remove 45 mg pioglitazone  from autofill.  Obesity   Obesity is improving with a 16-pound weight loss over the past three months, attributed to medication change and increased exercise. Continued weight management is crucial for overall health and diabetes control. Encourage daily exercise and healthy eating habits. Monitor weight and adjust the treatment plan as necessary.  Chronic kidney disease, unspecified   Chronic kidney disease is under monitoring. He reports frequent urination, likely due to high water intake.  Kidney function tests have been ordered, and results are pending. Continue Diovan  at the current dose to protect kidney function. Review kidney function test results when available.       Follow-up: Return in about 3 months (around 09/27/2024).  Butler Der, M.D.

## 2024-06-29 LAB — CBC WITH DIFFERENTIAL/PLATELET
Basophils Absolute: 0 x10E3/uL (ref 0.0–0.2)
Basos: 1 %
EOS (ABSOLUTE): 0.1 x10E3/uL (ref 0.0–0.4)
Eos: 3 %
Hematocrit: 41.1 % (ref 37.5–51.0)
Hemoglobin: 13.4 g/dL (ref 13.0–17.7)
Immature Grans (Abs): 0 x10E3/uL (ref 0.0–0.1)
Immature Granulocytes: 0 %
Lymphocytes Absolute: 1.8 x10E3/uL (ref 0.7–3.1)
Lymphs: 33 %
MCH: 31.6 pg (ref 26.6–33.0)
MCHC: 32.6 g/dL (ref 31.5–35.7)
MCV: 97 fL (ref 79–97)
Monocytes Absolute: 0.4 x10E3/uL (ref 0.1–0.9)
Monocytes: 6 %
Neutrophils Absolute: 3.2 x10E3/uL (ref 1.4–7.0)
Neutrophils: 57 %
Platelets: 264 x10E3/uL (ref 150–450)
RBC: 4.24 x10E6/uL (ref 4.14–5.80)
RDW: 13.1 % (ref 11.6–15.4)
WBC: 5.6 x10E3/uL (ref 3.4–10.8)

## 2024-06-29 LAB — MICROALBUMIN / CREATININE URINE RATIO
Creatinine, Urine: 69.8 mg/dL
Microalb/Creat Ratio: 38 mg/g{creat} — ABNORMAL HIGH (ref 0–29)
Microalbumin, Urine: 26.7 ug/mL

## 2024-06-29 LAB — LIPID PANEL
Cholesterol, Total: 114 mg/dL (ref 100–199)
HDL: 59 mg/dL (ref >39)
LDL CALC COMMENT:: 1.9 ratio (ref 0.0–5.0)
LDL Chol Calc (NIH): 42 mg/dL (ref 0–99)
Triglycerides: 55 mg/dL (ref 0–149)
VLDL Cholesterol Cal: 13 mg/dL (ref 5–40)

## 2024-06-29 LAB — PSA, TOTAL AND FREE
PSA, Free Pct: 32.5
PSA, Free: 0.26 ng/mL
Prostate Specific Ag, Serum: 0.8 ng/mL (ref 0.0–4.0)

## 2024-07-03 NOTE — Progress Notes (Signed)
Hello Oluwaferanmi,  Your lab result is normal and/or stable.Some minor variations that are not significant are commonly marked abnormal, but do not represent any medical problem for you.  Best regards, Jesten Cappuccio, M.D.

## 2024-07-17 DIAGNOSIS — M4722 Other spondylosis with radiculopathy, cervical region: Secondary | ICD-10-CM | POA: Diagnosis not present

## 2024-07-17 DIAGNOSIS — M4802 Spinal stenosis, cervical region: Secondary | ICD-10-CM | POA: Diagnosis not present

## 2024-07-28 DIAGNOSIS — K5090 Crohn's disease, unspecified, without complications: Secondary | ICD-10-CM | POA: Diagnosis not present

## 2024-08-07 ENCOUNTER — Other Ambulatory Visit: Payer: Self-pay | Admitting: Family Medicine

## 2024-08-17 ENCOUNTER — Encounter: Payer: Self-pay | Admitting: Orthopedic Surgery

## 2024-08-17 ENCOUNTER — Ambulatory Visit: Admitting: Orthopedic Surgery

## 2024-08-17 DIAGNOSIS — M5412 Radiculopathy, cervical region: Secondary | ICD-10-CM | POA: Diagnosis not present

## 2024-08-17 NOTE — Progress Notes (Signed)
 Office Visit Note   Patient: Mitchell Garza           Date of Birth: 08-06-1961           MRN: [id number] Visit Date: 08/17/2024 Requested by: Zollie Lowers, MD [address] [place],  [place] [address] PCP: Zollie Lowers, MD  Subjective: Chief Complaint  Patient presents with   Other    Follow up to review MRI    HPI: Gerard Cantara is a 63 y.o. male who presents to the office reporting neck pain and right radicular pain.  Since he was last seen has had an MRI scan of the cervical spine which is reviewed.  He has severe foraminal narrowing at C3-4 on the left C4-5 on the right and C5-6 laterally.  He denies much in the way of shoulder symptoms.  Does not take any medications because his pain is not constant.  He actually has to shake his right arm out in order to get the pain to pass.  Has had no prior surgery on the cervical spine..                ROS: All systems reviewed are negative as they relate to the chief complaint within the history of present illness.  Patient denies fevers or chills.  Assessment & Plan: Visit Diagnoses:  1. Radiculopathy, cervical region     Plan: Impression is radicular pain affecting the right arm including the shoulder.  No evidence of rotator cuff pathology or adhesive capsulitis at this time.  Plan is referral to Dr. Eldonna for cervical spine ESI for right-sided radiculopathy.  Follow-up with us  as needed.  Follow-Up Instructions: No follow-ups on file.   Orders:  Orders Placed This Encounter  Procedures   Ambulatory referral to Physical Medicine Rehab   No orders of the defined types were placed in this encounter.     Procedures: No procedures performed   Clinical Data: No additional findings.  Objective: Vital Signs: There were no vitals taken for this visit.  Physical Exam:  Constitutional: Patient appears well-developed HEENT:  Head: Normocephalic Eyes:EOM are normal Neck: Normal range of motion Cardiovascular: Normal  rate Pulmonary/chest: Effort normal Neurologic: Patient is alert Skin: Skin is warm Psychiatric: Patient has normal mood and affect  Ortho Exam: Ortho exam demonstrates excellent range of motion of the right of 70/110/175 with good rotator cuff strength testing infraspinatus supraspinatus and subscap muscle testing.  No discrete AC joint tenderness.  Negative Tinel's in the cubital tunnel.  Mild paresthesias in that C5-6 distribution on the right compared to the left but symmetric reflexes biceps and triceps.  Radial pulse intact bilaterally.  Specialty Comments:  No specialty comments available.  Imaging: No results found.   PMFS History: Patient Active Problem List   Diagnosis Date Noted   Diabetic lipidosis (HCC) 12/26/2021   Crohn's disease of large intestine without complication (HCC) 12/26/2021   Past Medical History:  Diagnosis Date   Crohn's disease (HCC)    Diabetes mellitus without complication (HCC)    Sleep apnea     Family History  Problem Relation Age of Onset   Alcohol abuse Mother    Diabetes Mother    Cancer Father    Diabetes Sister    Diabetes Brother     Past Surgical History:  Procedure Laterality Date   arthroscopic knee surgery Bilateral    Social History   Occupational History   Occupation: Retired  Tobacco Use   Smoking status: Every Day  Current packs/day: 1.00    Average packs/day: 1 pack/day for 30.0 years (30.0 ttl pk-yrs)    Types: Cigarettes   Smokeless tobacco: Never  Vaping Use   Vaping status: Never Used  Substance and Sexual Activity   Alcohol use: Not Currently    Comment: rare   Drug use: Never   Sexual activity: Not on file

## 2024-08-22 ENCOUNTER — Encounter: Payer: Self-pay | Admitting: Radiology

## 2024-09-06 ENCOUNTER — Other Ambulatory Visit: Payer: Self-pay | Admitting: *Deleted

## 2024-09-06 MED ORDER — TRULICITY 3 MG/0.5ML ~~LOC~~ SOAJ: 3.0000 mg | SUBCUTANEOUS | 0 refills | Status: DC

## 2024-09-06 NOTE — Telephone Encounter (Signed)
 Refill failed. resent

## 2024-09-06 NOTE — Addendum Note (Signed)
 Addended by: Reinette Cuneo D on: 09/06/2024 04:46 PM   Modules accepted: Orders

## 2024-09-07 ENCOUNTER — Other Ambulatory Visit: Payer: Self-pay

## 2024-09-07 ENCOUNTER — Ambulatory Visit: Admitting: Physical Medicine and Rehabilitation

## 2024-09-07 VITALS — BP 126/79 | HR 99

## 2024-09-07 DIAGNOSIS — M5412 Radiculopathy, cervical region: Secondary | ICD-10-CM | POA: Diagnosis not present

## 2024-09-07 NOTE — Progress Notes (Signed)
 Pain Scale   Average Pain 6 Patient advising he has chronic neck  pain to right shoulder ( Patient advising his shoulder pain increases when he moves his right arm- Patient advising his pain is not in his neck.        +Driver, -BT, -Dye Allergies.

## 2024-09-08 DIAGNOSIS — K5090 Crohn's disease, unspecified, without complications: Secondary | ICD-10-CM | POA: Diagnosis not present

## 2024-09-10 ENCOUNTER — Other Ambulatory Visit: Payer: Self-pay | Admitting: Family Medicine

## 2024-09-11 NOTE — Procedures (Signed)
 Cervical Epidural Steroid Injection - Interlaminar Approach with Fluoroscopic Guidance  Patient: Mitchell Garza      Date of Birth: 10-01-1961 MRN: [id number] PCP: Zollie Lowers, MD      Visit Date: 09/07/2024   Universal Protocol:    Date/Time: 11/23/257:25 PM  Consent Given By: the patient  Position: PRONE  Additional Comments: Vital signs were monitored before and after the procedure. Patient was prepped and draped in the usual sterile fashion. The correct patient, procedure, and site was verified.   Injection Procedure Details:   Procedure diagnoses: Cervical radiculopathy [M54.12]    Meds Administered: 40mg  Depo-Medrol   Laterality: Right  Location/Site: C7-T1  Needle: 3.5 in., 20 ga. Tuohy  Needle Placement: Paramedian epidural space  Findings:  -Comments: Excellent flow of contrast into the epidural space.  Procedure Details: Using a paramedian approach from the side mentioned above, the region overlying the inferior lamina was localized under fluoroscopic visualization and the soft tissues overlying this structure were infiltrated with 4 ml. of 1% Lidocaine without Epinephrine. A # 20 gauge, Tuohy needle was inserted into the epidural space using a paramedian approach.  The epidural space was localized using loss of resistance along with contralateral oblique bi-planar fluoroscopic views.  After negative aspirate for air, blood, and CSF, a 2 ml. volume of Isovue-250 was injected into the epidural space and the flow of contrast was observed. Radiographs were obtained for documentation purposes.   The injectate was administered into the level noted above.  Additional Comments:  The patient tolerated the procedure well Dressing: 2 x 2 sterile gauze and Band-Aid    Post-procedure details: Patient was observed during the procedure. Post-procedure instructions were reviewed.  Patient left the clinic in stable condition.

## 2024-09-11 NOTE — Progress Notes (Signed)
 Mitchell Garza - 63 y.o. male MRN [id number]  Date of birth: 29-Jun-1961  Office Visit Note: Visit Date: 09/07/2024 PCP: Zollie Lowers, MD Referred by: Zollie Lowers, MD  Subjective: Chief Complaint  Patient presents with   Neck - Pain   HPI:  Mitchell Garza is a 63 y.o. male who comes in today at the request of Dr. JUDITHANN Glendia Hutchinson for planned Right C7-T1 Cervical Interlaminar epidural steroid injection with fluoroscopic guidance.  The patient has failed conservative care including home exercise, medications, time and activity modification.  This injection will be diagnostic and hopefully therapeutic.  Please see requesting physician notes for further details and justification.   ROS Otherwise per HPI.  Assessment & Plan: Visit Diagnoses:    ICD-10-CM   1. Cervical radiculopathy  M54.12 XR C-ARM NO REPORT    Epidural Steroid injection      Plan: No additional findings.   Meds & Orders: No orders of the defined types were placed in this encounter.   Orders Placed This Encounter  Procedures   XR C-ARM NO REPORT   Epidural Steroid injection    Follow-up: Return for visit to requesting provider as needed.   Procedures: No procedures performed  Cervical Epidural Steroid Injection - Interlaminar Approach with Fluoroscopic Guidance  Patient: Mitchell Garza      Date of Birth: 1961/08/06 MRN: [id number] PCP: Zollie Lowers, MD      Visit Date: 09/07/2024   Universal Protocol:    Date/Time: 11/23/257:25 PM  Consent Given By: the patient  Position: PRONE  Additional Comments: Vital signs were monitored before and after the procedure. Patient was prepped and draped in the usual sterile fashion. The correct patient, procedure, and site was verified.   Injection Procedure Details:   Procedure diagnoses: Cervical radiculopathy [M54.12]    Meds Administered: 40mg  Depo-Medrol   Laterality: Right  Location/Site: C7-T1  Needle: 3.5 in., 20 ga. Tuohy  Needle Placement:  Paramedian epidural space  Findings:  -Comments: Excellent flow of contrast into the epidural space.  Procedure Details: Using a paramedian approach from the side mentioned above, the region overlying the inferior lamina was localized under fluoroscopic visualization and the soft tissues overlying this structure were infiltrated with 4 ml. of 1% Lidocaine without Epinephrine. A # 20 gauge, Tuohy needle was inserted into the epidural space using a paramedian approach.  The epidural space was localized using loss of resistance along with contralateral oblique bi-planar fluoroscopic views.  After negative aspirate for air, blood, and CSF, a 2 ml. volume of Isovue-250 was injected into the epidural space and the flow of contrast was observed. Radiographs were obtained for documentation purposes.   The injectate was administered into the level noted above.  Additional Comments:  The patient tolerated the procedure well Dressing: 2 x 2 sterile gauze and Band-Aid    Post-procedure details: Patient was observed during the procedure. Post-procedure instructions were reviewed.  Patient left the clinic in stable condition.   Clinical History: MRI CERVICAL SPINE WITHOUT CONTRAST, 07/17/2024 6:33 PM  INDICATION: Radiculopathy, cervical region \ M54.12 Radiculopathy, cervical region  COMPARISON: None    TECHNIQUE: Multiplanar, multisequence surface-coil magnetic resonance imaging of the cervical spine was performed without contrast.    LEVELS IMAGED: Foramen magnum to upper thoracic region.  FINDINGS: Alignment: No substantial subluxation.  Vertebrae: Vertebral body heights are maintained. No marrow signal abnormalities to suggest neoplasm.  Spinal cord: Normal signal.  Craniocervical junction: No focal abnormality.  Degenerative changes: Multilevel disc desiccation. C1-C2: No substantial  canal or foraminal stenosis. C2-C3: Posterior disc osteophyte complex, facet arthropathy contribute  to mild left foraminal stenosis. C3-C4: Posterior disc osteophyte complex, facet and uncovertebral arthropathy contribute to severe left foraminal stenosis. C4-C5: Disc height loss, posterior disc osteophyte complex, facet and uncovertebral arthropathy contribute to mild canal, severe right and moderate left foraminal stenosis. C5-C6: Posterior disc osteophyte complex, facet and uncovertebral arthropathy contribute to mild canal, severe bilateral foraminal stenosis. C6-C7: Posterior disc osteophyte complex, facet and uncovertebral arthropathy contribute to mild canal, moderate bilateral foraminal stenosis, right greater than left. C7-T1: Posterior disc osteophyte complex, facet and uncovertebral arthropathy contribute to mild right foraminal stenosis.  Visualized portion of the thoracic spine: Posterior disc osteophyte complex at T1-T2, T2-T3 and T3-T4 without high grade canal stenosis. Additional: Lymphoid hyperplasia of the oropharynx. IMPRESSION: 1.  Multilevel spondylosis as described above, resulting in mild central canal stenosis at C4-C7 and severe foraminal narrowing at C3-C4 (left), C4-C5 (right) and C5-C6 (lateral). . Additional levels of mild to moderate foraminal narrowing as above. 2.  No cervical cord signal abnormality. 3.  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 Exam End: 07/17/24     Objective:  VS:  HT:    WT:   BMI:     BP:126/79  HR:99bpm  TEMP: ( )  RESP:  Physical Exam Vitals and nursing note reviewed.  Constitutional:      General: He is not in acute distress.    Appearance: Normal appearance. He is not ill-appearing.  HENT:     Head: Normocephalic and atraumatic.     Right Ear: External ear normal.     Left Ear: External ear normal.  Eyes:     Extraocular Movements: Extraocular movements intact.  Cardiovascular:     Rate and Rhythm: Normal rate.     Pulses: Normal pulses.  Abdominal:     General: There is no distension.      Palpations: Abdomen is soft.  Musculoskeletal:        General: No signs of injury.     Cervical back: Neck supple. Tenderness present. No rigidity.     Right lower leg: No edema.     Left lower leg: No edema.     Comments: Patient has good strength in the upper extremities with 5 out of 5 strength in wrist extension long finger flexion APB.  No intrinsic hand muscle atrophy.  Negative Hoffmann's test.  Lymphadenopathy:     Cervical: No cervical adenopathy.  Skin:    Findings: No erythema or rash.  Neurological:     General: No focal deficit present.     Mental Status: He is alert and oriented to person, place, and time.     Sensory: No sensory deficit.     Motor: No weakness or abnormal muscle tone.     Coordination: Coordination normal.  Psychiatric:        Mood and Affect: Mood normal.        Behavior: Behavior normal.      Imaging: No results found.

## 2024-09-16 ENCOUNTER — Other Ambulatory Visit: Payer: Self-pay | Admitting: Family Medicine

## 2024-09-23 ENCOUNTER — Telehealth: Payer: Self-pay | Admitting: Gastroenterology

## 2024-09-23 NOTE — Telephone Encounter (Signed)
 Goodmorning Dr.Mansouraty,  Patient calling wanting to transfer his care specifically to you due to [facility] not excepting his insurance after January. Patient would need to be seen for crohn's disease and is scheduled to have his last injection the end of December. Patients records can be found in Epic.  Please advise for future scheduling  Thank you

## 2024-09-25 NOTE — Telephone Encounter (Signed)
 Patient can be accepted to the [facility], since he will not be able to have further follow-up with his current group due to insurance change. However until a clinic appointment occurs, which could be December or January or February, we cannot actually prescribe any of his current medical therapies. He needs to work with his current gastroenterologist/office to make sure that he has adequate therapy or medications or infusions set up for at least 6 months while he has time to transition to a new GI provider here at our office. He can be seen by any of the APP's in any Pod and be assigned to a new provider; or he can be seen by any of the MDs in the clinic. GM

## 2024-09-26 ENCOUNTER — Encounter: Payer: Self-pay | Admitting: Gastroenterology

## 2024-09-26 NOTE — Telephone Encounter (Signed)
 Good Morning Dr.Mansouraty,  Patient has been informed and scheduled for and office visit in January Please advise  Thank you

## 2024-09-27 ENCOUNTER — Ambulatory Visit: Payer: Self-pay | Admitting: Family Medicine

## 2024-09-28 ENCOUNTER — Encounter: Payer: Self-pay | Admitting: Family Medicine

## 2024-09-28 ENCOUNTER — Ambulatory Visit: Admitting: Family Medicine

## 2024-09-28 ENCOUNTER — Ambulatory Visit: Payer: Self-pay | Admitting: Family Medicine

## 2024-09-28 VITALS — BP 130/79 | HR 78 | Temp 97.2°F | Ht 71.0 in | Wt 248.0 lb

## 2024-09-28 DIAGNOSIS — Z7984 Long term (current) use of oral hypoglycemic drugs: Secondary | ICD-10-CM | POA: Diagnosis not present

## 2024-09-28 DIAGNOSIS — E119 Type 2 diabetes mellitus without complications: Secondary | ICD-10-CM | POA: Diagnosis not present

## 2024-09-28 DIAGNOSIS — E756 Lipid storage disorder, unspecified: Secondary | ICD-10-CM | POA: Diagnosis not present

## 2024-09-28 DIAGNOSIS — E1169 Type 2 diabetes mellitus with other specified complication: Secondary | ICD-10-CM

## 2024-09-28 LAB — BAYER DCA HB A1C WAIVED: HB A1C (BAYER DCA - WAIVED): 6.5 % — ABNORMAL HIGH (ref 4.8–5.6)

## 2024-09-28 MED ORDER — TRULICITY 3 MG/0.5ML ~~LOC~~ SOAJ: 3.0000 mg | SUBCUTANEOUS | 1 refills | Status: AC

## 2024-09-28 MED ORDER — METFORMIN HCL ER 500 MG PO TB24: ORAL_TABLET | ORAL | 0 refills | Status: AC

## 2024-09-28 NOTE — Progress Notes (Signed)
 Subjective:  [name], male    DOB: 09-17-1961  Age: 63 y.o. MRN: [id number]  CC: Medical Management of Chronic Issues   HPI  Discussed the use of AI scribe software for clinical note transcription with the patient, who gave verbal consent to proceed.  History of Present Illness Mitchell Garza is a 63 year old male with type 2 diabetes who presents for routine follow-up.  His blood sugar levels have been stable since discontinuing glimepiride , which previously caused hypoglycemic episodes. His current A1c is 6.5, and he has not experienced any recent episodes of hypoglycemia or hyperglycemia. He is currently taking metformin , two tablets in the morning and two at night, and Trulicity , which he recently refilled for a three-month supply.  He is also on cholesterol medication, which he tolerates well. His cholesterol levels were last checked three months ago and were reported as very good, with a value of 114.  No gastrointestinal issues such as constipation or diarrhea. He mentions having dust allergies that flare up occasionally due to being indoors more often.          06/28/2024   10:38 AM 08/25/2023    3:10 PM 04/02/2023   10:09 AM  Depression screen PHQ 2/9  Decreased Interest 0 0 0  Down, Depressed, Hopeless 0 0 0  PHQ - 2 Score 0 0 0    History Mitchell Garza has a past medical history of Crohn's disease (HCC), Diabetes mellitus without complication (HCC), and Sleep apnea.   He has a past surgical history that includes arthroscopic knee surgery (Bilateral).   His family history includes Alcohol abuse in his mother; Cancer in his father; Diabetes in his brother, mother, and sister.He reports that he has been smoking cigarettes. He has a 30 pack-year smoking history. He has never used smokeless tobacco. He reports that he does not currently use alcohol. He reports that he does not use drugs.    ROS Review of Systems  Constitutional:  Negative for fever.   Respiratory:  Negative for shortness of breath.   Cardiovascular:  Negative for chest pain.  Musculoskeletal:  Negative for arthralgias.  Skin:  Negative for rash.    Objective:  BP 130/79   Pulse 78   Temp (!) 97.2 F (36.2 C)   Ht 5' 11 (1.803 m)   Wt 248 lb (112.5 kg)   SpO2 94%   BMI 34.59 kg/m   BP Readings from Last 3 Encounters:  09/28/24 130/79  09/07/24 126/79  06/28/24 119/73    Wt Readings from Last 3 Encounters:  09/28/24 248 lb (112.5 kg)  06/28/24 248 lb (112.5 kg)  03/24/24 264 lb (119.7 kg)     Physical Exam Vitals reviewed.  Constitutional:      Appearance: He is well-developed.  HENT:     Head: Normocephalic and atraumatic.     Right Ear: External ear normal.     Left Ear: External ear normal.     Mouth/Throat:     Pharynx: No oropharyngeal exudate or posterior oropharyngeal erythema.  Eyes:     Pupils: Pupils are equal, round, and reactive to light.  Cardiovascular:     Rate and Rhythm: Normal rate and regular rhythm.     Heart sounds: No murmur heard. Pulmonary:     Effort: No respiratory distress.     Breath sounds: Normal breath sounds.  Musculoskeletal:     Cervical back: Normal range of motion and neck supple.  Neurological:     Mental  Status: He is alert and oriented to person, place, and time.    Physical Exam GENERAL: Alert, cooperative, well developed, no acute distress. HEENT: Normocephalic, normal oropharynx, moist mucous membranes. CHEST: Clear to auscultation bilaterally, no wheezes, rhonchi, or crackles. CARDIOVASCULAR: Normal heart rate and rhythm, S1 and S2 normal without murmurs. ABDOMEN: Soft, non-tender, non-distended, without organomegaly, normal bowel sounds. EXTREMITIES: No cyanosis or edema. NEUROLOGICAL: Cranial nerves grossly intact, moves all extremities without gross motor or sensory deficit.   Assessment & Plan:  Diabetes mellitus without complication (HCC) -     Bayer DCA Hb A1c Waived  Diabetic  lipidosis (HCC) -     Bayer DCA Hb A1c Waived  Other orders -     Trulicity ; Inject 3 mg as directed once a week.  Dispense: 6 mL; Refill: 1 -     metFORMIN  HCl ER; TAKE 4 TABLETS BY MOUTH DAILY WITH BREAKFAST.  Dispense: 360 tablet; Refill: 0    Assessment and Plan Assessment & Plan Type 2 diabetes mellitus   His diabetes is well-controlled with an A1c of 6.5%. There have been no episodes of hypoglycemia since stopping glimepiride  and no issues with hyperglycemia. Trulicity  and metformin  are effective and well-tolerated. Continue Trulicity  with a 2-month supply and metformin , 2 tablets in the morning and 2 at night.  Hyperlipidemia   His hyperlipidemia is well-managed with the current medication regimen. Previous cholesterol levels were excellent, with a total cholesterol of 114 mg/dL. Continued adherence to medication is important for cardiovascular risk reduction. Continue the current cholesterol medication regimen and blood work has been ordered to check cholesterol levels.  General Health Maintenance   Routine health maintenance is up to date. Recent blood work, including prostate-specific antigen, cholesterol, kidney, and liver function tests, were normal. Continue routine health maintenance and monitoring.       Follow-up: Return in about 3 months (around 12/27/2024).  Butler Der, M.D.

## 2024-10-28 ENCOUNTER — Other Ambulatory Visit: Payer: Self-pay

## 2024-10-28 DIAGNOSIS — Z122 Encounter for screening for malignant neoplasm of respiratory organs: Secondary | ICD-10-CM

## 2024-10-28 DIAGNOSIS — Z87891 Personal history of nicotine dependence: Secondary | ICD-10-CM

## 2024-11-03 ENCOUNTER — Other Ambulatory Visit

## 2024-11-03 ENCOUNTER — Ambulatory Visit: Admitting: Gastroenterology

## 2024-11-03 ENCOUNTER — Encounter: Payer: Self-pay | Admitting: Gastroenterology

## 2024-11-03 ENCOUNTER — Telehealth: Payer: Self-pay | Admitting: Gastroenterology

## 2024-11-03 VITALS — BP 132/70 | HR 92 | Ht 70.0 in | Wt 250.4 lb

## 2024-11-03 DIAGNOSIS — K222 Esophageal obstruction: Secondary | ICD-10-CM | POA: Diagnosis not present

## 2024-11-03 DIAGNOSIS — K5000 Crohn's disease of small intestine without complications: Secondary | ICD-10-CM | POA: Diagnosis not present

## 2024-11-03 DIAGNOSIS — Z860101 Personal history of adenomatous and serrated colon polyps: Secondary | ICD-10-CM

## 2024-11-03 DIAGNOSIS — Z79899 Other long term (current) drug therapy: Secondary | ICD-10-CM

## 2024-11-03 DIAGNOSIS — R1310 Dysphagia, unspecified: Secondary | ICD-10-CM | POA: Diagnosis not present

## 2024-11-03 LAB — COMPREHENSIVE METABOLIC PANEL WITH GFR
ALT: 23 U/L (ref 3–53)
AST: 16 U/L (ref 5–37)
Albumin: 4.3 g/dL (ref 3.5–5.2)
Alkaline Phosphatase: 50 U/L (ref 39–117)
BUN: 17 mg/dL (ref 6–23)
CO2: 31 meq/L (ref 19–32)
Calcium: 9.3 mg/dL (ref 8.4–10.5)
Chloride: 105 meq/L (ref 96–112)
Creatinine, Ser: 0.86 mg/dL (ref 0.40–1.50)
GFR: 92.59 mL/min
Glucose, Bld: 130 mg/dL — ABNORMAL HIGH (ref 70–99)
Potassium: 3.8 meq/L (ref 3.5–5.1)
Sodium: 141 meq/L (ref 135–145)
Total Bilirubin: 0.6 mg/dL (ref 0.2–1.2)
Total Protein: 7.4 g/dL (ref 6.0–8.3)

## 2024-11-03 LAB — CBC WITH DIFFERENTIAL/PLATELET
Basophils Absolute: 0 K/uL (ref 0.0–0.1)
Basophils Relative: 0.6 % (ref 0.0–3.0)
Eosinophils Absolute: 0.1 K/uL (ref 0.0–0.7)
Eosinophils Relative: 1.6 % (ref 0.0–5.0)
HCT: 40.4 % (ref 39.0–52.0)
Hemoglobin: 13.5 g/dL (ref 13.0–17.0)
Lymphocytes Relative: 26.9 % (ref 12.0–46.0)
Lymphs Abs: 1.6 K/uL (ref 0.7–4.0)
MCHC: 33.5 g/dL (ref 30.0–36.0)
MCV: 92.4 fl (ref 78.0–100.0)
Monocytes Absolute: 0.4 K/uL (ref 0.1–1.0)
Monocytes Relative: 6.8 % (ref 3.0–12.0)
Neutro Abs: 3.9 K/uL (ref 1.4–7.7)
Neutrophils Relative %: 64.1 % (ref 43.0–77.0)
Platelets: 261 K/uL (ref 150.0–400.0)
RBC: 4.37 Mil/uL (ref 4.22–5.81)
RDW: 13.9 % (ref 11.5–15.5)
WBC: 6 K/uL (ref 4.0–10.5)

## 2024-11-03 LAB — C-REACTIVE PROTEIN: CRP: <0.5 mg/dL — ABNORMAL LOW (ref 1.0–20.0)

## 2024-11-03 NOTE — Telephone Encounter (Signed)
 Pod C nurses, This patient was seen today to establish GI care with us  for his Crohn's disease.  He has previously been seen at [facility] but had to find a new GI practice due to a change in his insurance.  He is on Avsola  infusions every 6 weeks and last infusion was 10/19/2024, due for next infusion 11/30/2024.  Per note from last infusion:  Infusion Log  Treatment Regimen: Q6W Dose: 1100mg  10 mg/kg Weight: 250 lbs.   Please help me arrange infusions for this patient, thank you.

## 2024-11-03 NOTE — Patient Instructions (Signed)
 Please go to the lab in the basement of our building to have lab work done as you leave today. Hit B for basement when you get on the elevator.  When the doors open the lab is on your left.  We will call you with the results. Thank you.  A recall for colonoscopy has been placed with Dr. Wilhelmenia  We will work on setting up Avsola  infusions.  _______________________________________________________  If your blood pressure at your visit was 140/90 or greater, please contact your primary care physician to follow up on this.  _______________________________________________________  If you are age 74 or older, your body mass index should be between 23-30. Your Body mass index is 35.93 kg/m. If this is out of the aforementioned range listed, please consider follow up with your Primary Care Provider.  If you are age 67 or younger, your body mass index should be between 19-25. Your Body mass index is 35.93 kg/m. If this is out of the aformentioned range listed, please consider follow up with your Primary Care Provider.   ________________________________________________________  The [facility] providers would like to encourage you to use MYCHART to communicate with providers for non-urgent requests or questions.  Due to long hold times on the telephone, sending your provider a message by [facility] may be a faster and more efficient way to get a response.  Please allow 48 business hours for a response.  Please remember that this is for non-urgent requests.  _______________________________________________________  [facility] is using a team-based approach to care.  Your team is made up of your doctor and two to three APPS. Our APPS (Nurse Practitioners and Physician Assistants) work with your physician to ensure care continuity for you. They are fully qualified to address your health concerns and develop a treatment plan. They communicate directly with your gastroenterologist to care for  you. Seeing the Advanced Practice Practitioners on your physician's team can help you by facilitating care more promptly, often allowing for earlier appointments, access to diagnostic testing, procedures, and other specialty referrals.   Due to recent changes in healthcare laws, you may see the results of your imaging and laboratory studies on MyChart before your provider has had a chance to review them.  We understand that in some cases there may be results that are confusing or concerning to you. Not all laboratory results come back in the same time frame and the provider may be waiting for multiple results in order to interpret others.  Please give us  48 hours in order for your provider to thoroughly review all the results before contacting the office for clarification of your results.

## 2024-11-03 NOTE — Progress Notes (Signed)
 "  Mitchell Garza [id number] June 12, 1961   Chief Complaint: Crohn's disease, establish care  Referring Provider: Zollie Lowers, MD Primary GI MD: Dr. Wilhelmenia  HPI: Mitchell Garza is a 64 y.o. male with past medical history of Crohn's disease, diabetes, HTN, HLD, sleep apnea who presents today to establish care.    Patient has previously followed with [facility], requested transfer of care to Dr. Wilhelmenia as [facility] no longer accepts his insurance.  On Avsola  infusions every 6 weeks.  Last infusion 10/19/2024.  Per office visit note 03/15/2024 with [facility], patient had been evaluated in [place]  state early 2021 for 2 years of diarrhea with approximately 10-15 bowel movements per day.  Colonoscopy 01/2020 showed terminal ileitis, multiple transverse colon adenomas removed as well as rectal hyperplastic polyps.  Ileal pathology showed mild chronic active inflammation.  He then underwent MRI 03/2020 showing terminal ileitis, sigmoid colitis, and gallbladder sludge.  Appears he was to be started on Entyvio and budesonide, then a rheumatology note from 08/2020 stated he improved on Entyvio, other notes suggested Inflectra was started.  Overall does not appear he was ever started on Entyvio. He then moved to [facility].  Colonoscopy 09/2020 showed normal TI/colonic mucosa and 1 sigmoid colon adenoma removed. Inflectra levels 09/2021 showed infliximab  at 11 and no antibodies. Due to insurance changes was switched to Avsola  which he seems to be tolerating without apparent symptoms. Office visit 02/2023, continued to do well from a Crohn's perspective, tolerating Avsola , reported some rare dysphagia to pills/solids.  EGD showed a proximal stricture, colonoscopy showed mild ileitis and adenomas removed.  Per last GI visit 03/15/2024, patient was in clinical remission from Crohn's ileitis and tolerating Avsola  infusions 1000 mg every 6 weeks.  Advised to have repeat EGD with dilation as needed, plan for repeat  colonoscopy for polyp surveillance at 3 years (04/2026).  Noted to have mild normocytic anemia with normal iron, B12, and folate.  Had previously been evaluated by hematology in 2024.  Advised to return to clinic in a year.  Patient does follow with [facility], last visit 09/28/2024 and denied any GI issues at that time.   Discussed the use of AI scribe software for clinical note transcription with the patient, who gave verbal consent to proceed.  History of Present Illness Mitchell Garza is a 64 year old male with Crohn's disease, prior colonic polyps, and esophageal stricture status post dilation who presents for ongoing management of Crohn's disease.  Crohn's Disease Symptoms and Management: - Diagnosed in 2021 following significant diarrhea - Initially treated with Inflectra, transitioned to Avsola  infusions due to insurance change - Receives Avsola  every six weeks; last infusion October 19, 2024, next due November 30, 2024 - Experienced symptom worsening during a lapse in infusions related to relocation previously - No history of steroid use for Crohn's disease - Two to three bowel movements daily - Intermittent episodes of increased frequency, up to five or six times per day - Episodes associated with change in odor and looser, soft ice creamish consistency - Episodes last two to three days and occur randomly between infusions - Sensation of fullness during episodes, relieved by bowel movements - No blood in stool, abdominal pain, black stools, nausea, vomiting, or heartburn  Colonic Polyps and Surveillance: - Colonoscopy in July 2024 with removal of three precancerous polyps  Esophageal Stricture and Dysphagia: - Upper endoscopy in July 2024 with esophageal dilation for choking episodes - Significant improvement in choking since dilation - Experienced a choking episode  the morning of this visit, described as food or liquid going down the wrong pipe, causing panic and  difficulty catching breath - Avoids suckers due to choking risk - No regular difficulty with solid foods  Ocular Symptoms: - Developed swelling and increased tearing of the right eye one to two weeks ago - Some blurring and sensation of bruising under the eyelid - Swelling has improved, mild discomfort persists - History of childhood styes - No new rashes, joint pain, or eye redness  Sleep Apnea: - History of sleep apnea with inconsistent CPAP use due to discomfort - Wife observes increased daytime sleepiness when not using CPAP - He feels he sleeps through the night without CPAP - No alternative treatments pursued  Musculoskeletal Symptoms: - Chronic knee pain - Received steroid injection in the neck for a shoulder problem unrelated to Crohn's disease    Previous GI Procedures/Imaging   EGD 04/2023 Proximal esophageal stricture - dilated to 52 Fr; esophageal inlet patch; gastric antral erythema (bx negative for H. Pylori); duodenal bulb erythema (bx negative celiac disease)   Colonoscopy 04/2023 - mildly active Crohn's ileitis (4-5 medium sized aphthous ulcers with normal appearing interlaying mucosa - bx c/w CD); four 4-5 mm transverse polyps (3 adenomas), pan-diverticulosis, internal hemorrhoids   Past Medical History:  Diagnosis Date   Colon polyps    Crohn's disease (HCC)    Diabetes mellitus without complication (HCC)    HLD (hyperlipidemia)    HTN (hypertension)    Kidney stones    Sleep apnea    CPAP    Past Surgical History:  Procedure Laterality Date   ABDOMINAL SURGERY     as a child   arthroscopic knee surgery Bilateral     Current Outpatient Medications  Medication Sig Dispense Refill   amLODipine  (NORVASC ) 5 MG tablet TAKE 1 TABLET BY MOUTH EVERY DAY FOR BLOOD PRESSURE 90 tablet 0   blood glucose meter kit and supplies KIT Dispense based on patient and insurance preference. Use up to four times daily as directed. (FOR ICD-10 : E11.9 1 each 9    Dulaglutide  (TRULICITY ) 3 MG/0.5ML SOAJ Inject 3 mg as directed once a week. 6 mL 1   inFLIXimab  (REMICADE) 100 MG injection Inject into the vein every 6 (six) weeks.     metFORMIN  (GLUCOPHAGE -XR) 500 MG 24 hr tablet TAKE 4 TABLETS BY MOUTH DAILY WITH BREAKFAST. 360 tablet 0   pioglitazone  (ACTOS ) 30 MG tablet Take 1 tablet (30 mg total) by mouth daily. 90 tablet 1   sildenafil  (REVATIO ) 20 MG tablet Take 1 tablet (20 mg total) by mouth daily as needed. 10 tablet 5   simvastatin  (ZOCOR ) 40 MG tablet TAKE 1 TABLET BY MOUTH EVERY DAY 90 tablet 0   traZODone  (DESYREL ) 150 MG tablet USE FROM 1/3 TO 1 TABLET NIGHTLY AS NEEDED FOR SLEEP. 90 tablet 3   valsartan  (DIOVAN ) 320 MG tablet TAKE 1 TABLET (320 MG TOTAL) BY MOUTH DAILY. FOR BLOOD PRESSURE AND KIDNEY PROTECTION 90 tablet 0   No current facility-administered medications for this visit.    Allergies as of 11/03/2024   (No Known Allergies)    Family History  Problem Relation Age of Onset   Alcohol abuse Mother    Diabetes Mother    Melanoma Father    Liver cancer Father    Diabetes Sister    Hypertension Sister    Diverticulitis Sister    Diabetes Brother     Social History[1]   Review of Systems:  Constitutional: No weight loss, fever, chills, weakness or fatigue Eyes: No change in vision Ears, Nose, Throat:  No change in hearing or congestion Skin: No rash or itching Cardiovascular: No chest pain, chest pressure or palpitations   Respiratory: No SOB or cough Gastrointestinal: See HPI and otherwise negative Genitourinary: No dysuria or change in urinary frequency Neurological: No headache, dizziness or syncope Musculoskeletal: No new muscle or joint pain Hematologic: No bleeding or bruising    Physical Exam:  Vital signs: BP 132/70 (BP Location: Left Arm, Patient Position: Sitting, Cuff Size: Normal)   Pulse 92   Ht 5' 10 (1.778 m) Comment: Height measured without shoes  Wt 250 lb 6 oz (113.6 kg)   BMI 35.93 kg/m    Wt Readings from Last 3 Encounters:  11/03/24 250 lb 6 oz (113.6 kg)  09/28/24 248 lb (112.5 kg)  06/28/24 248 lb (112.5 kg)     Constitutional: NAD, Well developed, Well nourished, alert and cooperative Head:  Normocephalic and atraumatic.  Eyes: No scleral icterus. Conjunctiva pink. Mouth: No oral lesions. Respiratory: Respirations even and unlabored. Lungs clear to auscultation bilaterally.  No wheezes, crackles, or rhonchi.  Cardiovascular:  Regular rate and rhythm. No murmurs. No peripheral edema. Gastrointestinal:  Soft, nondistended, nontender. No rebound or guarding. Normal bowel sounds. No appreciable masses or hepatomegaly. Rectal:  Not performed.  Neurologic:  Alert and oriented x4;  grossly normal neurologically.  Skin:   Dry and intact without significant lesions or rashes. Psychiatric: Oriented to person, place and time. Demonstrates good judgement and reason without abnormal affect or behaviors.      Assessment/Plan:   Assessment & Plan Crohn's disease In clinical remission with mild intermittent symptoms between Avsola  infusions. No active flare, bleeding, or extraintestinal manifestations. Transitioning care and infusion site due to insurance concerns.  - Baseline labs today: CBC, CMP, ESR, CRP, Quant Gold TB, Hep B serologies - Scheduled Avsola  infusions; coordinated insurance assistance to prevent interruption. - Ordered fecal calprotectin  - Will work on setting up Avsola  infusions (Feb 11) - Deferred Avsola  antibody testing; reconsider if symptoms worsen or at follow-up. - Recall for colonoscopy in 2027, unless earlier indicated by flare or new symptoms. - Follow up 3 months with Dr. Wilhelmenia  History of colonic polyps Three precancerous polyps on last colonoscopy. Surveillance interval established; no family history of colorectal cancer.  - Recall for surveillance colonoscopy in 2027, unless earlier indicated by Crohn's flare or new  symptoms.  Esophageal stricture status post dilation Previously treated with dilation, significant improvement. Rare choking with liquids, no dysphagia to solids.  - Monitor for dysphagia or choking; consider repeat endoscopy and dilation if symptoms worsen.    Camie Furbish, PA-C [facility] 11/03/2024, 9:54 AM  Patient Care Team: Zollie Lowers, MD as PCP - General (Family Medicine)       [1]  Social History Tobacco Use   Smoking status: Former    Current packs/day: 0.00    Average packs/day: 1 pack/day for 30.0 years (30.0 ttl pk-yrs)    Types: Cigarettes    Quit date: 03/2022    Years since quitting: 2.6   Smokeless tobacco: Never  Vaping Use   Vaping status: Never Used  Substance Use Topics   Alcohol use: Yes    Comment: 1-2 times a month   Drug use: Never   "

## 2024-11-04 ENCOUNTER — Ambulatory Visit: Payer: Self-pay | Admitting: Gastroenterology

## 2024-11-04 ENCOUNTER — Other Ambulatory Visit: Payer: Self-pay

## 2024-11-04 ENCOUNTER — Other Ambulatory Visit (HOSPITAL_COMMUNITY): Payer: Self-pay

## 2024-11-04 NOTE — Progress Notes (Signed)
 Patient currently on Avsola  10mg /kg every 7 weeks  Pre-medications: acetaminophen 650mg  p.o, Solumedrol 40mg  IV, diphenhydramine 25mg  p.o.  Last infusion was on 10/19/2024  Kya Mayfield, PharmD, MPH, BCPS, CPP Clinical Pharmacist

## 2024-11-04 NOTE — Telephone Encounter (Signed)
 Orders entered in EPIC Infusion Navigator for [employer] on [employer].

## 2024-11-06 LAB — HEPATITIS B SURFACE ANTIBODY,QUALITATIVE: Hep B S Ab: NONREACTIVE

## 2024-11-06 LAB — QUANTIFERON-TB GOLD PLUS
Mitogen-NIL: >10 [IU]/mL
NIL: 0.04 [IU]/mL
QuantiFERON-TB Gold Plus: NEGATIVE
TB1-NIL: <0 [IU]/mL
TB2-NIL: <0 [IU]/mL

## 2024-11-06 LAB — SEDIMENTATION RATE: Sed Rate: 6 mm/h (ref 0–20)

## 2024-11-06 LAB — HEPATITIS B SURFACE ANTIGEN: Hepatitis B Surface Ag: NONREACTIVE

## 2024-11-06 LAB — HEPATITIS B CORE ANTIBODY, TOTAL: Hep B Core Total Ab: NONREACTIVE

## 2024-11-07 ENCOUNTER — Telehealth: Payer: Self-pay | Admitting: Pharmacy Technician

## 2024-11-07 ENCOUNTER — Ambulatory Visit: Payer: Self-pay

## 2024-11-07 NOTE — Telephone Encounter (Signed)
 Auth Submission: PENDING Site of care: Site of care: [facility] Payer: BC[employer] commercial  Medication & CPT/J Code(s) submitted: Avsola  (infliximab -axxq) [id number] Diagnosis Code: K50.10 Route of submission (phone, fax, portal): latent portal  Phone # Fax # Auth type: Buy/Bill PB Units/visits requested: 1100 mg q 6 weeks  Reference number:  Approval from:  to     PRIOR AUTH PENDING

## 2024-11-07 NOTE — Telephone Encounter (Signed)
 FYI Only or Action Required?: Action required by provider: appointment booked outside dispo for tomorrow .  Patient was last seen in primary care on 09/28/2024 by Zollie Lowers, MD.  Called Nurse Triage reporting Eye Pain.  Symptoms began a week ago.  Interventions attempted: Other: warm compress.  Symptoms are: stable.  Triage Disposition: Discuss With PCP and Callback by Nurse Today (overriding See HCP Within 4 Hours (Or PCP Triage))  Patient/caregiver understands and will follow disposition?: No, refuses disposition     Reason for Disposition  MODERATE eye pain or discomfort (e.g., interferes with normal activities or awakens from sleep; more than mild)  Answer Assessment - Initial Assessment Questions Patient reports symptoms started at least 1 week  ago.  Right eye Woke up one morning eye watery  and itchy next couple days swollen and red eye socket feel bruised and constant flow of fluid liquid out of eye not pus vision is blurry thinks related  to the liquid. irritated red. In the morning waking eye crusted shut needs warm compress. Right eyelid ren and tender to touch and down into top of right cheek back of the eye . Pain is 3/10 if presses increased and feels like a bruise. Patient does not want to go to UC tonight wants appointment tomorrow booked appointment tomorrow per patient request 11/08/24 booked appointment tomorrow 845 AM will route to office as was booked outside dispo patient states will seek ER if worsening in meantime.     ** pt requesting appointment 1/22 be cancelled**     1. ONSET: When did the pain start? (e.g., minutes, hours, days)     1 week or so  2. TIMING: Does the pain come and go, or has it been constant since it started? (e.g., constant, intermittent, fleeting)     Constant  3. SEVERITY: How bad is the pain?  (Scale 1-10; mild, moderate or severe)     3/10  4. LOCATION: Where does it hurt?  (e.g., eyelid, eye, cheekbone)     Right eye  socket and eyelid and below cheek bone 5. CAUSE: What do you think is causing the pain?     Unknown but wondering if conjunctivitis 6. VISION: Do you have blurred vision or changes in your vision?      Yes blurry  thinks related  to the liquid if wipes its improved  7. EYE DISCHARGE: Is there any discharge (pus) from the eye(s)?  If Yes, ask: What color is it?      Clear liquid  8. FEVER: Do you have a fever? If Yes, ask: What is it, how was it measured, and when did it start?      Denies  9. OTHER SYMPTOMS: Do you have any other symptoms? (e.g., headache, nasal discharge, facial rash)     Patient denies the following fever , chills, vomiting  Protocols used: Eye Pain and Other Symptoms-A-AH Message from [place] [facility] sent at 11/07/2024  1:43 PM EST  Reason for Triage: Pt called reporting swelling, pain, and possible conjunctivitis.

## 2024-11-08 ENCOUNTER — Encounter: Payer: Self-pay | Admitting: Family Medicine

## 2024-11-08 ENCOUNTER — Ambulatory Visit: Admitting: Family Medicine

## 2024-11-08 VITALS — BP 127/80 | HR 86 | Temp 97.6°F | Ht 70.0 in | Wt 248.0 lb

## 2024-11-08 DIAGNOSIS — H00012 Hordeolum externum right lower eyelid: Secondary | ICD-10-CM | POA: Diagnosis not present

## 2024-11-08 MED ORDER — AMOXICILLIN-POT CLAVULANATE 875-125 MG PO TABS: 1.0000 | ORAL_TABLET | Freq: Two times a day (BID) | ORAL | 0 refills | Status: AC

## 2024-11-08 MED ORDER — ERYTHROMYCIN 5 MG/GM OP OINT: 1.0000 | TOPICAL_OINTMENT | Freq: Every day | OPHTHALMIC | 0 refills | Status: AC

## 2024-11-08 NOTE — Progress Notes (Addendum)
 "  Acute Office Visit  [name], male    DOB: 01-20-1961, 64 y.o.   MRN: [id number]  PCP: Mitchell Lowers, MD  Chief Complaint  Patient presents with   Eye Problem    Patient reports right eye has been red, itchy, drainage, and swollen for about a week. Patient denies pain unless he puts pressure on it, and then his whole eye socket feels bruised. Patient has tried using OTC Systane eye drops for relief, and says it has helped relieve some of the itching/burning feeling.    Subjective:     Eye Problem     Discussed the use of AI scribe software for clinical note transcription with the patient, who gave verbal consent to proceed.  History of Present Illness   Mitchell Garza is a 64 year old male who presents with a red, itchy, draining, and swollen right eye for about a week.  Ocular symptoms - Redness, itching, drainage tears, and swelling of the right eye for approximately one week - No pain unless pressure is applied to the eye - Symptoms have gradually worsened over the week, but today is the best day in terms of appearance - Over-the-counter Systane eye drops provide some relief for itching and burning   Associated symptoms - No fever  Ophthalmologic history - History of styes as a child, which would cause eye swelling       ROS     Objective:    BP 127/80 (Cuff Size: Large)   Pulse 86   Temp 97.6 F (36.4 C)   Ht 5' 10 (1.778 m)   Wt 248 lb (112.5 kg)   SpO2 94%   BMI 35.58 kg/m    Physical Exam Vitals reviewed.  Constitutional:      Appearance: Normal appearance.  HENT:     Head: Normocephalic and atraumatic.     Right Ear: Tympanic membrane, ear canal and external ear normal.     Left Ear: Tympanic membrane, ear canal and external ear normal.     Ears:     Comments: Moderate amount of cerumen R canal Eyes:     Extraocular Movements: Extraocular movements intact.     Pupils: Pupils are equal, round, and reactive to light.      Comments: Erythema and swelling along R lower eyelid with lesion that appears consistent with a hordeolum to the inside of the R lower eyelid.  No drainage.   Cardiovascular:     Rate and Rhythm: Normal rate and regular rhythm.     Pulses: Normal pulses.     Heart sounds: Normal heart sounds. No murmur heard. Pulmonary:     Effort: Pulmonary effort is normal. No respiratory distress.     Breath sounds: Normal breath sounds.  Musculoskeletal:        General: No deformity. Normal range of motion.     Cervical back: Normal range of motion and neck supple.  Skin:    General: Skin is warm and dry.  Neurological:     General: No focal deficit present.     Mental Status: He is alert and oriented to person, place, and time.  Psychiatric:        Mood and Affect: Mood normal.        Behavior: Behavior normal.       No results found for any visits on 11/08/24.     Assessment & Plan:   Problem List Items Addressed This Visit   None Visit Diagnoses  Hordeolum of right lower eyelid, unspecified hordeolum type    -  Primary   Relevant Medications   erythromycin  ophthalmic ointment   amoxicillin -clavulanate (AUGMENTIN ) 875-125 MG tablet       Assessment and Plan    Hordeolum externum of right lower eyelid - Prescribed Augmentin  to cover for possible preseptal cellulitis due to erythema and swelling.  - Prescribed erythromycin  eye ointment. - Instructed to apply warm compresses several times a day. - Follow up if symptoms acutely worsen, no improvement over the next 2-3 days, fever develops, or for any other concerns         Meds ordered this encounter  Medications   erythromycin  ophthalmic ointment    Sig: Place 1 Application into the right eye at bedtime.    Dispense:  3.5 g    Refill:  0    Supervising Provider:   GOTTSCHALK, ASHLY M [[id number]]   amoxicillin -clavulanate (AUGMENTIN ) 875-125 MG tablet    Sig: Take 1 tablet by mouth 2 (two) times daily for 7 days.     Dispense:  14 tablet    Refill:  0    Supervising Provider:   JOLINDA NORENE HERO [[id number]]    Return if symptoms worsen or fail to improve.  Mitchell LELON Severin, FNP [facility]   "

## 2024-11-08 NOTE — Patient Instructions (Signed)
 Follow up if symptoms acutely worsen, no improvement over the next 2-3 days, fever develops, or for any other concerns

## 2024-11-09 ENCOUNTER — Encounter: Payer: Self-pay | Admitting: Gastroenterology

## 2024-11-10 ENCOUNTER — Ambulatory Visit: Admitting: Family Medicine

## 2024-11-10 NOTE — Progress Notes (Signed)
 Attending Physician's Attestation   I have reviewed the chart.   I agree with the Advanced Practitioner's note, impression, and recommendations with any updates as below.    Corliss Parish, MD [facility] Office # [phone]

## 2024-11-30 ENCOUNTER — Ambulatory Visit

## 2024-12-27 ENCOUNTER — Ambulatory Visit: Admitting: Family Medicine

## 2025-05-08 ENCOUNTER — Ambulatory Visit: Admitting: Physician Assistant
